# Patient Record
Sex: Male | Born: 1955
Health system: Southern US, Community
[De-identification: ages and names within clinical notes are randomized; demographics above are authoritative.]

## PROBLEM LIST (undated history)

## (undated) DIAGNOSIS — Z87442 Personal history of urinary calculi: Secondary | ICD-10-CM

## (undated) DIAGNOSIS — C801 Malignant (primary) neoplasm, unspecified: Secondary | ICD-10-CM

## (undated) DIAGNOSIS — K4090 Unilateral inguinal hernia, without obstruction or gangrene, not specified as recurrent: Secondary | ICD-10-CM

## (undated) DIAGNOSIS — I4891 Unspecified atrial fibrillation: Secondary | ICD-10-CM

## (undated) DIAGNOSIS — J302 Other seasonal allergic rhinitis: Secondary | ICD-10-CM

## (undated) DIAGNOSIS — J329 Chronic sinusitis, unspecified: Secondary | ICD-10-CM

## (undated) DIAGNOSIS — Z8489 Family history of other specified conditions: Secondary | ICD-10-CM

## (undated) HISTORY — PX: HERNIA REPAIR: SHX51

## (undated) HISTORY — PX: BASAL CELL CARCINOMA EXCISION: SHX1214

## (undated) HISTORY — PX: OTHER SURGICAL HISTORY: SHX169

---

## 1997-10-12 ENCOUNTER — Ambulatory Visit (HOSPITAL_COMMUNITY): Admission: RE | Admit: 1997-10-12 | Discharge: 1997-10-12 | Payer: Self-pay | Admitting: Dermatology

## 1999-09-01 ENCOUNTER — Ambulatory Visit (HOSPITAL_COMMUNITY): Admission: RE | Admit: 1999-09-01 | Discharge: 1999-09-01 | Payer: Self-pay | Admitting: Gastroenterology

## 1999-09-01 ENCOUNTER — Encounter: Payer: Self-pay | Admitting: Gastroenterology

## 1999-09-01 ENCOUNTER — Encounter (INDEPENDENT_AMBULATORY_CARE_PROVIDER_SITE_OTHER): Payer: Self-pay | Admitting: Specialist

## 2003-12-14 ENCOUNTER — Ambulatory Visit (HOSPITAL_BASED_OUTPATIENT_CLINIC_OR_DEPARTMENT_OTHER): Admission: RE | Admit: 2003-12-14 | Discharge: 2003-12-14 | Payer: Self-pay | Admitting: Family Medicine

## 2005-10-03 ENCOUNTER — Emergency Department (HOSPITAL_COMMUNITY): Admission: EM | Admit: 2005-10-03 | Discharge: 2005-10-03 | Payer: Self-pay | Admitting: Emergency Medicine

## 2005-10-04 ENCOUNTER — Ambulatory Visit (HOSPITAL_COMMUNITY): Admission: RE | Admit: 2005-10-04 | Discharge: 2005-10-04 | Payer: Self-pay | Admitting: Emergency Medicine

## 2006-01-26 ENCOUNTER — Emergency Department (HOSPITAL_COMMUNITY): Admission: EM | Admit: 2006-01-26 | Discharge: 2006-01-26 | Payer: Self-pay | Admitting: Emergency Medicine

## 2007-01-07 ENCOUNTER — Emergency Department (HOSPITAL_COMMUNITY): Admission: EM | Admit: 2007-01-07 | Discharge: 2007-01-07 | Payer: Self-pay | Admitting: Emergency Medicine

## 2007-05-15 DIAGNOSIS — I4891 Unspecified atrial fibrillation: Secondary | ICD-10-CM

## 2007-05-15 HISTORY — DX: Unspecified atrial fibrillation: I48.91

## 2008-01-25 ENCOUNTER — Emergency Department (HOSPITAL_COMMUNITY): Admission: EM | Admit: 2008-01-25 | Discharge: 2008-01-25 | Payer: Self-pay | Admitting: Emergency Medicine

## 2008-04-21 ENCOUNTER — Inpatient Hospital Stay (HOSPITAL_COMMUNITY): Admission: EM | Admit: 2008-04-21 | Discharge: 2008-04-22 | Payer: Self-pay | Admitting: Emergency Medicine

## 2010-09-26 NOTE — H&P (Signed)
[name], [name] NO.:  [id number]   MEDICAL RECORD NO.:  [id number]          PATIENT TYPE:  INP   LOCATION:  2038                         FACILITY:  [facility]   PHYSICIAN:  Jose Bathe, MD      DATE OF BIRTH:  10/13/1955   DATE OF ADMISSION:  04/21/2008  DATE OF DISCHARGE:                              HISTORY & PHYSICAL   PRIMARY CARE PHYSICIAN:  Jose L. Foy Guadalajara, MD   CHIEF COMPLAINT:  New-onset atrial fibrillation.   HISTORY OF PRESENT ILLNESS:  This is a 55 year old male with no prior  cardiac history who developed new-onset atrial fibrillation earlier this  morning.  He woke up in the middle of the with a bout of diarrhea x2 and  felt quite diaphoretic and nauseous surrounding this episode.  He denied  any fevers or chills.  He did feel some palpitations and some dizziness.  He went back to lay down and still continued to feel this why, and  therefore, went over to [name] for further  evaluation.  Once there in the [facility], he was noted to be in atrial  fibrillation with rapid ventricular response in the 140s.  He was then  promptly sent over to [name] for further  evaluation where an EKG confirmed the atrial fibrillation.  Cardizem  drip was then initiated and effectively, so his heart rate down into the  70s.  His blood pressure remained in the 90s systolic and 80s to 90s  systolic.  Therefore, he was given 1 L of normal saline bolus.   As far as exacerbation of atrial fibrillation, he does consume a large  quantity of Diet [facility] daily.  He also has seasonal allergies  with large doses of ALLEGRA-D or SUDAFED utilize.  He denies any alcohol  use.  He denies any previous thyroid problems.  Three 3 years ago, he  had an exercise treadmill test with [name] in [name], which  included an echo portion and supposedly his heart structurally and  functionally was normal.   He did have some associated  chest discomfort left sided with this  episode, rated as a 3-4/10 in intensity, and is currently very mild,  less than 1/10.  Currently, it is quite pinpoint along the left axillary  wall.  He has not had any exertional chest pain, however, recently.  He  denies any current shortness of breath.  He has lost approximately 40-50  pounds over the past several years.   PAST MEDICAL HISTORY:  1. Allergic rhinitis.  2. Psoriasis.  3. Obstructive sleep apnea - no longer wear a CPAP mass secondary to      aggressive weight loss.   ALLERGIES:  PENICILLIN.   MEDICATIONS:  1. Allegra-D 180 mg to 240 mg tablets once a day.  2. Astelin 137 mcg spray 2 puffs each nostril twice a day.  3. Advair Diskus 100/50 mcg dose 1 every 12 hours.  4. Singulair 10 mg a day in the evening.  5. Allergy shots by [name].   SOCIAL HISTORY:  He  denies any alcohol, tobacco, illicit drug use.  He  is married.  His wife is [name] for [name].  He is quite active.  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   FAMILY HISTORY:  He does have a brother who has had early coronary  artery disease with prior angioplasty, but he is overweight and smoker.  His paternal grandfather had a myocardial infarction at age 53.  His  mother supposedly had a myocardial infarction in her 30s without any  symptoms and was told this later in life.   REVIEW OF SYSTEMS:  Denies any fever, syncope, orthopnea.  Is positive  for dizziness, mild chest discomfort as above.  Unless specified above,  all other 12 review of systems is negative.  He denies any rash, recent  fevers.   PHYSICAL EXAMINATION:  VITAL SIGNS:  Blood pressure currently 90s  systolic, heart rate in the 70s atrial fibrillation originally was in  the 150s, respiration rate 16 and afebrile.  GENERAL:  Alert and oriented x3 in no acute distress, resting  comfortably in bed.  EYES:  Well-perfused conjunctivae.  EOMI.  No scleral icterus.   NECK:  Supple.  No thyromegaly, no carotid bruits, no JVD.  Full range  of motion.  CARDIOVASCULAR:  Irregularly irregular rhythm with no appreciable  murmurs.  Normal PMI.  LUNGS:  Clear to auscultation bilaterally.  No wheezes, no rales.  Normal respiratory effort.  ABDOMEN:  Soft, nontender, normoactive bowel sounds.  No rebound, no  guarding, no bruits.  EXTREMITIES:  No clubbing, cyanosis, or edema.  Normal distal pulses.  SKIN:  Warm, dry, and intact.  No rashes noted.  NEUROLOGIC:  Nonfocal.  No tremors.  PSYCH:  Normal affect.   LABORATORY DATA:  Cardiac biomarkers are normal x1.  Potassium was 4.9,  hemoglobin 17.7, hematocrit 52, creatinine 1.2.  White count 7.7,  platelets 290.  EKG as described above.  Chest x-ray personally reviewed  shows no acute airspace disease.  Prior medical records reviewed.  Echocardiogram from December 03, 2003, showed ejection fraction of 60% with  mild LVH 12 mm, the left atrial size was 37 mm.  All valves were normal.  He exercised for 10 minutes on the Bruce protocol, and there was no  chest pain or ST-segment changes.   ASSESSMENT AND PLAN:  This is a 55 year old male with new-onset atrial  fibrillation.  1. New-onset atrial fibrillation - Currently well-rate controlled on      Cardizem.  We will convert over to p.o. dose.  I will go ahead and      anticoagulate him with Lovenox just in case we need to cardiovert      him in the near future.  Hopefully, he will auto convert on his      own.  Possible etiologies to this atrial fibrillation include a      recent viral illness such as viral gastroenteritis.  Also caffeine      and Sudafed use are likely exacerbating factors.  We will check a      thyroid function.  He will need to decrease both of these.  Recent      echocardiogram in 2005 showed structurally normal heart.  2. Hypotension - likely secondary to mild amount of dehydration as      well as loss of atrial kick from atrial  fibrillation.  We will go      ahead and hydrate him with 1 L of normal  saline.  No other      therapies for now.  We will monitor closely in the setting of      Cardizem.  I will also evaluate with echocardiogram in near future.      Chest pain is quite atypical and now is clearly pinpoint, i.e.,      musculoskeletal, first set of cardiac biomarkers      are normal.  We will follow.  I may wish to perform a stress test      at later date.  I will also initiate Coumadin at this time just in      case cardioversion needs to take place in which case he would need      to be on Coumadin for 3 weeks.      Jose Bathe, MD  Electronically Signed     MCS/MEDQ  D:  04/21/2008  T:  04/22/2008  Job:  [id number]   cc:   Joycelyn Pace, M.D.

## 2010-09-26 NOTE — Discharge Summary (Signed)
[name], MUZQUIZ NO.:  [id number]   MEDICAL RECORD NO.:  [id number]          PATIENT TYPE:  INP   LOCATION:  2038                         FACILITY:  [facility]   PHYSICIAN:  Jake Bathe, MD      DATE OF BIRTH:  06-04-1955   DATE OF ADMISSION:  04/21/2008  DATE OF DISCHARGE:  04/22/2008                               DISCHARGE SUMMARY   PRIMARY CARE PHYSICIAN:  Dr. Foy Guadalajara.   FINAL DIAGNOSES:  1. Paroxysmal atrial fibrillation.  2. Hypotension.  3. Seasonal allergies - Dr. Artesia Callas, [facility] - allergy shots.  4. Obstructive sleep apnea - no longer wearing continuous positive      airway pressure mask secondary to weight loss of 40-50 pounds.  5. Psoriasis.   BRIEF HOSPITAL COURSE:  A 55 year old male with new-onset paroxysmal  atrial fibrillation who was admitted yesterday after seeing Dr. Joycelyn Rua with symptoms of dizziness and palpitations.  Earlier that night,  he had had 2 bouts of diarrhea and felt diaphoretic.  He did associate  some mild chest discomfort during this setting, which now on discharge  is completely relieved.  An echocardiogram was performed in 2005 which  was normal showing a normal left atrial size.  This was during an echo  stress test at Dr. Elyn Aquas office which showed no evidence of ischemia  and exercise time of 10 minutes.  After he was seen in the emergency  department, a Cardizem drip was initiated, then switched to a p.o.  He  was given Lovenox and aspirin.  At 1837, he auto converted to sinus  rhythm and has been in sinus rhythm/sinus tachycardia since.  He was  given 1 liter of bolus since he had hypotension and dizziness.  He  appeared mildly dehydrated.   As far as exacerbating factors of atrial fibrillation, it is possible  viral gastroenteritis, i.e., diarrhea that set atrial fibrillation off.  He also has concomitant excessive Mountain Dew/caffeine use as well as  Allegra-D with Sudafed in addition to Advair  with a long-acting beta  agonist.  On morning of discharge, he was back to his normal state of  health, feeling well, ambulating well.  His wife is Insurance claims handler at [employer] and is accompanying him at his  bedside.   On day of discharge:  VITAL SIGNS:  Blood pressure was 95/58 (normally at home, he runs in the  100-110 range), temperature 97.3, sating 98% on room air, heart rate was  75, respirations 20.  GENERAL:  Alert and oriented x3, in no acute distress.  CARDIOVASCULAR:  Regular rate and rhythm with no murmurs, rubs, or  gallops.  LUNGS:  Clear to auscultation bilaterally.  No wheezes.  No rales.  ABDOMEN:  Soft, nontender, normoactive bowel sounds.  EXTREMITIES:  No clubbing, cyanosis, or edema.  Normal distal pulses.   LABORATORY DATA:  White count 6.2, hemoglobin 15.6, hematocrit 45.6,  platelets 224.  Potassium 4.9.  Cardiac biomarkers normal.  TSH 1.0,  normal.   Chest x-ray, no acute disease.   DISCHARGE MEDICATIONS:  I asked them to  speak to Dr. [place] Callas who is going  to see him later this week about discontinuation of the D part of the  Allegra-D and also consideration of discontinuation of Advair given the  long-acting beta agonist.  1. Aspirin 81 mg once a day.  2. Cardizem CD 180 mg once a day.  3. Singular 10 mg a day.  4. Allegra 180 mg once a day.  5. Astelin nasal spray 137 mcg 2 puffs each nostril twice daily.  6. Advair 100/50 mcg every 12 hours.  7. Allergy shots weekly.   DISCHARGE INSTRUCTIONS:  I have asked him to reduce or eliminate  caffeine use.  He knows to report to me immediately if his symptoms  worsen.   APPOINTMENT:  He has his followup appointment with me on May 12, 2008, at 9:00 a.m. with an echocardiogram scheduled at that time to  ensure that there is no structural change of his heart in the setting of  new-onset atrial fibrillation.      Jake Bathe, MD  Electronically Signed     MCS/MEDQ  D:   04/22/2008  T:  04/22/2008  Job:  [id number]   cc:   Percival Spanish, M.D.  Joycelyn Rua, M.D.  Dr. Foy Guadalajara

## 2010-09-29 NOTE — Procedures (Signed)
NAME:  [name], [name]            ACCOUNT NO.:  [id number]   MEDICAL RECORD NO.:  [id number]          PATIENT TYPE:  OUT   LOCATION:  SLEEP CENTER                 FACILITY[facility]  [facility]   PHYSICIAN:  Clinton D. Maple Hudson, M.D. DATE OF BIRTH:  1956/01/09   DATE OF ADMISSION:  12/14/2003  DATE OF DISCHARGE:  12/14/2003                              NOCTURNAL POLYSOMNOGRAM   REFERRING PHYSICIAN:  Dr. Molly Maduro L. Foy Guadalajara.   INDICATION FOR STUDY/HISTORY:  Hypersomnia with sleep apnea.  Complaints of  excessive daytime fatigue, frequent waking after sleep onset, snoring.  The  patient had had a previous sleep study about 10 years ago; that report is  not available.   EPWORTH SLEEPINESS SCORE:  11/24.   NECK SIZE:  Sixteen inches.   BODY MASS INDEX:  BMI 29.6, weight 200 pounds.   MEDICATIONS:  No medications were listed.   SLEEP ARCHITECTURE:  Total sleep time 404 minutes for a sleep efficiency of  87%.  Stage I was 6%, stage II 60%, stages III and IV 14%; REM was 20% of  total sleep time. Latency to sleep onset was 9 minutes.  Latency to REM was  75 minutes.  Awake after sleep onset--64 minutes.  Arousal index--51 times  per hour.   RESPIRATORY DATA:   SPLIT-STUDY PROTOCOL:  Severe obstructive sleep apnea/hypopnea syndrome,  respiratory disturbance index (RDI) 50 per hour before CPAP including 36  obstructive apneas, 11 central apneas and 95 hypopneas.  Events were mostly  associated with supine sleeping position.  CPAP was titrated to 13 CWP (RDI  1.1 per hour) using a medium Respironics Comfort Gel nasal/oral mask with  heated humidifier.   OXYGEN DATA:  Moderate snoring before CPAP associated with mild-to-moderate  oxygen desaturation, 87%.  Some artifactual desaturations were recorded to  58%, which did not represent physiologic events.  Oxygenation on  CPAP was  held over 95% on room air.   CARDIAC DATA:  Normal cardiac rhythm.   MOVEMENT/PARASOMNIA:  Seventy-four body jerks were  recorded with very few  causing sleep disturbance.   IMPRESSION/RECOMMENDATION:  Severe obstructive sleep apnea/hypopnea syndrome  (respiratory disturbance index 50 per hour) with mild oxygen desaturation.  Successful control at continuous positive airway pressure 13 CWP using a  medium Respironics Comfort Gel nasal/oral mask with heated humidifier.                                   ______________________________                                Rennis Chris Maple Hudson, M.D.                                Diplomate, American Board of Sleep Medicine    CDY/MEDQ  D:  12/19/2003 10:32:18  T:  12/20/2003 14:13:52  Job:  [id number]

## 2011-02-16 LAB — POCT CARDIAC MARKERS
CKMB, poc: <1 ng/mL — ABNORMAL LOW (ref 1.0–8.0)
Troponin i, poc: <0.05 ng/mL (ref 0.00–0.09)

## 2011-02-16 LAB — CARDIAC PANEL(CRET KIN+CKTOT+MB+TROPI)
CK, MB: 0.7 ng/mL (ref 0.3–4.0)
Relative Index: INVALID (ref 0.0–2.5)
Relative Index: INVALID (ref 0.0–2.5)
Troponin I: <0.01 ng/mL (ref 0.00–0.06)

## 2011-02-16 LAB — POCT I-STAT, CHEM 8
Calcium, Ion: 1.21 mmol/L (ref 1.12–1.32)
Sodium: 140 mEq/L (ref 135–145)

## 2011-02-16 LAB — CBC
HCT: 51.4 % (ref 39.0–52.0)
Hemoglobin: 17.4 g/dL — ABNORMAL HIGH (ref 13.0–17.0)
MCHC: 33.7 g/dL (ref 30.0–36.0)
MCV: 90.6 fL (ref 78.0–100.0)
Platelets: 290 10*3/uL (ref 150–400)
RBC: 5.03 MIL/uL (ref 4.22–5.81)
RDW: 11.9 % (ref 11.5–15.5)
WBC: 6.2 10*3/uL (ref 4.0–10.5)

## 2011-02-16 LAB — TSH: TSH: 1.011 u[IU]/mL (ref 0.350–4.500)

## 2011-02-16 LAB — DIFFERENTIAL
Basophils Absolute: 0 10*3/uL (ref 0.0–0.1)
Basophils Relative: 1 % (ref 0–1)
Eosinophils Relative: 0 % (ref 0–5)
Monocytes Absolute: 0.6 10*3/uL (ref 0.1–1.0)

## 2011-02-16 LAB — POTASSIUM: Potassium: 4.9 mEq/L (ref 3.5–5.1)

## 2011-02-16 LAB — PROTIME-INR
Prothrombin Time: 12.7 seconds (ref 11.6–15.2)
Prothrombin Time: 13.4 seconds (ref 11.6–15.2)

## 2013-03-14 ENCOUNTER — Encounter: Payer: Self-pay | Admitting: Emergency Medicine

## 2013-03-14 ENCOUNTER — Emergency Department
Admission: EM | Admit: 2013-03-14 | Discharge: 2013-03-14 | Disposition: A | Discharge status: Home / Self Care | Payer: 59 | Source: Home / Self Care | Attending: Family Medicine | Admitting: Family Medicine

## 2013-03-14 DIAGNOSIS — H6093 Unspecified otitis externa, bilateral: Secondary | ICD-10-CM

## 2013-03-14 DIAGNOSIS — H6120 Impacted cerumen, unspecified ear: Secondary | ICD-10-CM

## 2013-03-14 DIAGNOSIS — H60399 Other infective otitis externa, unspecified ear: Secondary | ICD-10-CM

## 2013-03-14 DIAGNOSIS — H6123 Impacted cerumen, bilateral: Secondary | ICD-10-CM

## 2013-03-14 MED ORDER — NEOMYCIN-POLYMYXIN-HC 3.5-10000-1 OT SOLN: 3.0000 [drp] | Freq: Four times a day (QID) | OTIC | Status: AC

## 2013-03-14 MED ORDER — CARBAMIDE PEROXIDE 6.5 % OT SOLN: 5.0000 [drp] | Freq: Two times a day (BID) | OTIC | Status: DC

## 2013-03-14 NOTE — ED Notes (Signed)
States he is unable to hear out of ears d/t ear wax impaction.  Returned this am from scuba diving.

## 2013-03-14 NOTE — ED Provider Notes (Signed)
CSN: [id number]     Arrival date & time 03/14/13  1109 History   First MD Initiated Contact with Patient 03/14/13 1110     Chief Complaint  Patient presents with  . Cerumen Impaction    HPI  Bilateral cerumen impaction  Pt went to vacation in [place].  Went scuba diving.  Immediately felt bilateral ear pressure and discomfort.  Has had decreased hearing on R side.  No dizziness or headache.   History reviewed. No pertinent past medical history. Past Surgical History  Procedure Laterality Date  . Basal cell carcinoma excision    . Nasal biopsy     Family History  Problem Relation Age of Onset  . Cancer Father    History  Substance Use Topics  . Smoking status: Former Games developer  . Smokeless tobacco: Never Used  . Alcohol Use: No    Review of Systems  All other systems reviewed and are negative.    Allergies  Review of patient's allergies indicates no known allergies.  Home Medications  No current outpatient prescriptions on file. BP 138/83  Pulse 83  Temp(Src) 98.1 F (36.7 C) (Oral)  Ht 5\' 9"  (1.753 m)  Wt 200 lb (90.719 kg)  BMI 29.52 kg/m2  SpO2 97% Physical Exam  Constitutional: He appears well-developed and well-nourished.  HENT:  Head: Normocephalic and atraumatic.  Bilateral cerumen impaction   Eyes: Conjunctivae are normal. Pupils are equal, round, and reactive to light.  Neck: Normal range of motion. Neck supple.  Cardiovascular: Normal rate and regular rhythm.   Pulmonary/Chest: Effort normal.  Abdominal: Soft.  Musculoskeletal: Normal range of motion.  Neurological: He is alert.  Skin: Skin is warm.    ED Course  Procedures (including critical care time) Labs Review Labs Reviewed - No data to display Imaging Review No results found.    MDM   1. Cerumen impaction, bilateral   2. Otitis externa, bilateral    Bilateral ear irrigation at bedside.  Mild bilateral ear canal erythema s/p irrigation.  Will place on course of debrox and  cortisporin for treatment.  Discussed general care and ENT red flags.  Follow up as needed.     The patient and/or caregiver has been counseled thoroughly with regard to treatment plan and/or medications prescribed including dosage, schedule, interactions, rationale for use, and possible side effects and they verbalize understanding. Diagnoses and expected course of recovery discussed and will return if not improved as expected or if the condition worsens. Patient and/or caregiver verbalized understanding.          Doree Albee, MD 03/14/13 [id number]

## 2013-09-20 ENCOUNTER — Emergency Department (HOSPITAL_COMMUNITY): Payer: Commercial Managed Care - PPO

## 2013-09-20 ENCOUNTER — Encounter (HOSPITAL_COMMUNITY): Payer: Self-pay | Admitting: Emergency Medicine

## 2013-09-20 ENCOUNTER — Emergency Department (HOSPITAL_COMMUNITY)
Admission: EM | Admit: 2013-09-20 | Discharge: 2013-09-20 | Disposition: A | Discharge status: Home / Self Care | Payer: Commercial Managed Care - PPO | Attending: Emergency Medicine | Admitting: Emergency Medicine

## 2013-09-20 DIAGNOSIS — R918 Other nonspecific abnormal finding of lung field: Secondary | ICD-10-CM

## 2013-09-20 DIAGNOSIS — Z87891 Personal history of nicotine dependence: Secondary | ICD-10-CM | POA: Insufficient documentation

## 2013-09-20 DIAGNOSIS — Z8679 Personal history of other diseases of the circulatory system: Secondary | ICD-10-CM | POA: Insufficient documentation

## 2013-09-20 DIAGNOSIS — Z880 Allergy status to penicillin: Secondary | ICD-10-CM | POA: Insufficient documentation

## 2013-09-20 DIAGNOSIS — Z79899 Other long term (current) drug therapy: Secondary | ICD-10-CM | POA: Insufficient documentation

## 2013-09-20 DIAGNOSIS — Z87442 Personal history of urinary calculi: Secondary | ICD-10-CM | POA: Insufficient documentation

## 2013-09-20 DIAGNOSIS — R61 Generalized hyperhidrosis: Secondary | ICD-10-CM | POA: Insufficient documentation

## 2013-09-20 DIAGNOSIS — R911 Solitary pulmonary nodule: Secondary | ICD-10-CM | POA: Insufficient documentation

## 2013-09-20 DIAGNOSIS — R1032 Left lower quadrant pain: Secondary | ICD-10-CM | POA: Insufficient documentation

## 2013-09-20 DIAGNOSIS — R109 Unspecified abdominal pain: Secondary | ICD-10-CM

## 2013-09-20 DIAGNOSIS — M79609 Pain in unspecified limb: Secondary | ICD-10-CM | POA: Insufficient documentation

## 2013-09-20 HISTORY — DX: Unspecified atrial fibrillation: I48.91

## 2013-09-20 LAB — COMPREHENSIVE METABOLIC PANEL
ALBUMIN: 3.4 g/dL — AB (ref 3.5–5.2)
ALT: 14 U/L (ref 0–53)
AST: 13 U/L (ref 0–37)
Alkaline Phosphatase: 59 U/L (ref 39–117)
BUN: 16 mg/dL (ref 6–23)
CALCIUM: 8.9 mg/dL (ref 8.4–10.5)
CO2: 28 mEq/L (ref 19–32)
CREATININE: 0.92 mg/dL (ref 0.50–1.35)
Chloride: 100 mEq/L (ref 96–112)
GFR calc Af Amer: >90 mL/min (ref >90)
GFR calc non Af Amer: >90 mL/min (ref >90)
Glucose, Bld: 105 mg/dL — ABNORMAL HIGH (ref 70–99)
Potassium: 4.2 mEq/L (ref 3.7–5.3)
Sodium: 137 mEq/L (ref 137–147)
TOTAL PROTEIN: 6.4 g/dL (ref 6.0–8.3)
Total Bilirubin: 1.5 mg/dL — ABNORMAL HIGH (ref 0.3–1.2)

## 2013-09-20 LAB — URINALYSIS, ROUTINE W REFLEX MICROSCOPIC
Bilirubin Urine: NEGATIVE
GLUCOSE, UA: NEGATIVE mg/dL
Hgb urine dipstick: NEGATIVE
Ketones, ur: NEGATIVE mg/dL
LEUKOCYTES UA: NEGATIVE
Nitrite: NEGATIVE
PH: 8 (ref 5.0–8.0)
PROTEIN: NEGATIVE mg/dL
SPECIFIC GRAVITY, URINE: 1.034 — AB (ref 1.005–1.030)
Urobilinogen, UA: 0.2 mg/dL (ref 0.0–1.0)

## 2013-09-20 LAB — LIPASE, BLOOD: Lipase: 29 U/L (ref 11–59)

## 2013-09-20 LAB — CBC WITH DIFFERENTIAL/PLATELET
BASOS ABS: 0 10*3/uL (ref 0.0–0.1)
BASOS PCT: 0 % (ref 0–1)
EOS PCT: 1 % (ref 0–5)
Eosinophils Absolute: 0.1 10*3/uL (ref 0.0–0.7)
HEMATOCRIT: 47.9 % (ref 39.0–52.0)
Hemoglobin: 16.7 g/dL (ref 13.0–17.0)
Lymphocytes Relative: 25 % (ref 12–46)
Lymphs Abs: 1.9 10*3/uL (ref 0.7–4.0)
MCH: 30.8 pg (ref 26.0–34.0)
MCHC: 34.9 g/dL (ref 30.0–36.0)
MCV: 88.4 fL (ref 78.0–100.0)
MONO ABS: 0.7 10*3/uL (ref 0.1–1.0)
Monocytes Relative: 9 % (ref 3–12)
Neutro Abs: 4.8 10*3/uL (ref 1.7–7.7)
Neutrophils Relative %: 64 % (ref 43–77)
PLATELETS: 254 10*3/uL (ref 150–400)
RBC: 5.42 MIL/uL (ref 4.22–5.81)
RDW: 12.2 % (ref 11.5–15.5)
WBC: 7.5 10*3/uL (ref 4.0–10.5)

## 2013-09-20 MED ORDER — HYDROMORPHONE HCL PF 1 MG/ML IJ SOLN
1.0000 mg | Freq: Once | INTRAMUSCULAR | Status: AC
  Administered 2013-09-20: 1 mg via INTRAVENOUS
  Filled 2013-09-20: qty 1

## 2013-09-20 MED ORDER — NAPROXEN 500 MG PO TABS: 500.0000 mg | ORAL_TABLET | Freq: Two times a day (BID) | ORAL | Status: DC

## 2013-09-20 MED ORDER — ONDANSETRON HCL 4 MG/2ML IJ SOLN
4.0000 mg | Freq: Once | INTRAMUSCULAR | Status: AC
  Administered 2013-09-20: 4 mg via INTRAVENOUS
  Filled 2013-09-20: qty 2

## 2013-09-20 MED ORDER — MORPHINE SULFATE 4 MG/ML IJ SOLN
4.0000 mg | Freq: Once | INTRAMUSCULAR | Status: AC
  Administered 2013-09-20: 4 mg via INTRAVENOUS
  Filled 2013-09-20: qty 1

## 2013-09-20 MED ORDER — METHOCARBAMOL 500 MG PO TABS: 500.0000 mg | ORAL_TABLET | Freq: Two times a day (BID) | ORAL | Status: DC

## 2013-09-20 MED ORDER — IOHEXOL 300 MG/ML  SOLN
100.0000 mL | Freq: Once | INTRAMUSCULAR | Status: AC | PRN
  Administered 2013-09-20: 100 mL via INTRAVENOUS

## 2013-09-20 MED ORDER — KETOROLAC TROMETHAMINE 30 MG/ML IJ SOLN
30.0000 mg | Freq: Once | INTRAMUSCULAR | Status: AC
  Administered 2013-09-20: 30 mg via INTRAVENOUS
  Filled 2013-09-20: qty 1

## 2013-09-20 MED ORDER — IOHEXOL 300 MG/ML  SOLN
50.0000 mL | Freq: Once | INTRAMUSCULAR | Status: AC | PRN
  Administered 2013-09-20: 50 mL via ORAL

## 2013-09-20 MED ORDER — OXYCODONE-ACETAMINOPHEN 5-325 MG PO TABS: 1.0000 | ORAL_TABLET | ORAL | Status: DC | PRN

## 2013-09-20 MED ORDER — SODIUM CHLORIDE 0.9 % IV SOLN
INTRAVENOUS | Status: DC
  Administered 2013-09-20: 11:00:00 via INTRAVENOUS

## 2013-09-20 MED ORDER — SODIUM CHLORIDE 0.9 % IV BOLUS (SEPSIS)
1000.0000 mL | Freq: Once | INTRAVENOUS | Status: AC
  Administered 2013-09-20: 1000 mL via INTRAVENOUS

## 2013-09-20 NOTE — Discharge Instructions (Signed)
Abdominal Pain, Adult Many things can cause abdominal pain. Usually, abdominal pain is not caused by a disease and will improve without treatment. It can often be observed and treated at home. Your health care provider will do a physical exam and possibly order blood tests and X-rays to help determine the seriousness of your pain. However, in many cases, more time must pass before a clear cause of the pain can be found. Before that point, your health care provider may not know if you need more testing or further treatment. HOME CARE INSTRUCTIONS  Monitor your abdominal pain for any changes. The following actions may help to alleviate any discomfort you are experiencing:  Only take over-the-counter or prescription medicines as directed by your health care provider.  Do not take laxatives unless directed to do so by your health care provider.  Try a clear liquid diet (broth, tea, or water) as directed by your health care provider. Slowly move to a bland diet as tolerated. SEEK MEDICAL CARE IF:  You have unexplained abdominal pain.  You have abdominal pain associated with nausea or diarrhea.  You have pain when you urinate or have a bowel movement.  You experience abdominal pain that wakes you in the night.  You have abdominal pain that is worsened or improved by eating food.  You have abdominal pain that is worsened with eating fatty foods. SEEK IMMEDIATE MEDICAL CARE IF:   Your pain does not go away within 2 hours.  You have a fever.  You keep throwing up (vomiting).  Your pain is felt only in portions of the abdomen, such as the right side or the left lower portion of the abdomen.  You pass bloody or black tarry stools. MAKE SURE YOU:  Understand these instructions.   Will watch your condition.   Will get help right away if you are not doing well or get worse.  Document Released: 02/07/2005 Document Revised: 02/18/2013 Document Reviewed: 01/07/2013 [facility] Patient  Information 2014 [place]. You have a pulmonary nodule on the right side of your lungs. Call your Dr. to schedule a followup CAT scan of your chest. Return here at once for fever, vomiting, worsening abdominal pain. Pulmonary Nodule A pulmonary nodule is a small, round growth of tissue in the lung. Pulmonary nodules can range in size from less than 1/5 inch (4 mm) to a little bigger than an inch (25 mm). Most pulmonary nodules are detected when imaging tests of the lung are being performed for a different problem. Pulmonary nodules are usually not cancerous (benign). However, some pulmonary nodules are cancerous (malignant). Follow-up treatment or testing is based on the size of the pulmonary nodule and your risk of getting lung cancer.  CAUSES Benign pulmonary nodules can be caused by various things. Some of the causes include:   Bacterial, fungal, or viral infections. This is usually an old infection that is no longer active, but it can sometimes be a current, active infection.  A benign mass of tissue.  Inflammation from conditions such as rheumatoid arthritis.   Abnormal blood vessels in the lungs. Malignant pulmonary nodules can result from lung cancer or from cancers that spread to the lung from other places in the body. SIGNS AND SYMPTOMS Pulmonary nodules usually do not cause symptoms. DIAGNOSIS Most often, pulmonary nodules are found incidentally when an X-ray or CT scan is performed to look for some other problem in the lung area. To help determine whether a pulmonary nodule is benign or malignant, your  health care provider will take a medical history and order a variety of tests. Tests done may include:   Blood tests.  A skin test called a tuberculin test. This test is used to determine if you have been exposed to the germ that causes tuberculosis.   Chest X-rays. If possible, a new X-ray may be compared with X-rays you have had in the past.   CT scan. This test shows  smaller pulmonary nodules more clearly than an X-ray.   Positron emission tomography (PET) scan. In this test, a safe amount of a radioactive substance is injected into the bloodstream. Then, the scan takes a picture of the pulmonary nodule. The radioactive substance is eliminated from your body in your urine.   Biopsy. A tiny piece of the pulmonary nodule is removed so it can be checked under a microscope. TREATMENT  Pulmonary nodules that are benign normally do not require any treatment because they usually do not cause symptoms or breathing problems. Your health care provider may want to monitor the pulmonary nodule through follow-up CT scans. The frequency of these CT scans will vary based on the size of the nodule and the risk factors for lung cancer. For example, CT scans will need to be done more frequently if the pulmonary nodule is larger and if you have a history of smoking and a family history of cancer. Further testing or biopsies may be done if any follow-up CT scan shows that the size of the pulmonary nodule has increased. HOME CARE INSTRUCTIONS  Only take over-the-counter or prescription medicines as directed by your health care provider.  Keep all follow-up appointments with your health care provider. SEEK MEDICAL CARE IF:  You have trouble breathing when you are active.   You feel sick or unusually tired.   You do not feel like eating.   You lose weight without trying to.   You develop chills or night sweats.  SEEK IMMEDIATE MEDICAL CARE IF:  You cannot catch your breath, or you begin wheezing.   You cannot stop coughing.   You cough up blood.   You become dizzy or feel like you are going to pass out.   You have sudden chest pain.   You have a fever or persistent symptoms for more than 2 3 days.   You have a fever and your symptoms suddenly get worse. MAKE SURE YOU:  Understand these instructions.  Will watch your condition.  Will get help  right away if you are not doing well or get worse. Document Released: 02/25/2009 Document Revised: 12/31/2012 Document Reviewed: 10/20/2012 [facility] Patient Information 2014 [place].

## 2013-09-20 NOTE — ED Notes (Signed)
Pt attempted to provide urine specimen unable to do so at this time

## 2013-09-20 NOTE — ED Notes (Signed)
Pt has urinal and aware of need for urine specimen 

## 2013-09-20 NOTE — ED Notes (Signed)
Per pt, about 1 hour ago, pt c/o llq abdominal pain.  States hx of kidney stones but this does not "feel" the same.  Pain in abdomen and does not radiate to side/flank or back.  Nausea no vomiting.  Cold Sweats.  Diarrhea.

## 2013-09-20 NOTE — ED Provider Notes (Signed)
CSN: [id number]     Arrival date & time 09/20/13  0924 History   First MD Initiated Contact with Patient 09/20/13 [phone]     Chief Complaint  Patient presents with  . Abdominal Pain     (Consider location/radiation/quality/duration/timing/severity/associated sxs/prior Treatment) Patient is a 58 y.o. male presenting with abdominal pain. The history is provided by the patient and the spouse.  Abdominal Pain  patient here complaining of sudden onset of left lower quadrant pain it began approximate hour ago. Symptoms began at rest and is characterized as sharp and worse with movement. They are  better with rest. History of renal colic but this feels different. Denies any hematuria or dysuria. No fever or chills. Had some diarrhea but no nausea or vomiting. There was no blood in his stool. Did have some diaphoresis associated with this pain as well as some leg cramping. No rashes noted to have his flank. Denies any testicular pain or swelling. Symptoms have been persistent and no treatment used prior to arrival. Denies any history of diverticulitis or diverticulosis.  Past Medical History  Diagnosis Date  . Kidney stones   . Atrial fibrillation     history   Past Surgical History  Procedure Laterality Date  . Basal cell carcinoma excision    . Nasal biopsy     Family History  Problem Relation Age of Onset  . Cancer Father    History  Substance Use Topics  . Smoking status: Former Research scientist (life sciences)  . Smokeless tobacco: Never Used  . Alcohol Use: No    Review of Systems  Gastrointestinal: Positive for abdominal pain.  All other systems reviewed and are negative.     Allergies  Penicillins  Home Medications   Prior to Admission medications   Medication Sig Start Date End Date Taking? Authorizing Provider  carbamide peroxide (DEBROX) 6.5 % otic solution Place 5 drops into both ears 2 (two) times daily. 03/14/13   Shanda Howells, MD   BP 125/82  Pulse 79  Temp(Src) 97.7 F (36.5 C)  (Axillary)  Resp 18  SpO2 100% Physical Exam  Nursing note and vitals reviewed. Constitutional: He is oriented to person, place, and time. He appears well-developed and well-nourished.  Non-toxic appearance. No distress.  HENT:  Head: Normocephalic and atraumatic.  Eyes: Conjunctivae, EOM and lids are normal. Pupils are equal, round, and reactive to light.  Neck: Normal range of motion. Neck supple. No tracheal deviation present. No mass present.  Cardiovascular: Normal rate, regular rhythm and normal heart sounds.  Exam reveals no gallop.   No murmur heard. Pulmonary/Chest: Effort normal and breath sounds normal. No stridor. No respiratory distress. He has no decreased breath sounds. He has no wheezes. He has no rhonchi. He has no rales.  Abdominal: Soft. Normal appearance and bowel sounds are normal. He exhibits no distension. There is tenderness in the left lower quadrant. There is no rigidity, no rebound, no guarding and no CVA tenderness. Hernia confirmed negative in the left inguinal area.    Genitourinary: Testes normal.    Circumcised.  Musculoskeletal: Normal range of motion. He exhibits no edema and no tenderness.  Neurological: He is alert and oriented to person, place, and time. He has normal strength. No cranial nerve deficit or sensory deficit. GCS eye subscore is 4. GCS verbal subscore is 5. GCS motor subscore is 6.  Skin: Skin is warm and dry. No abrasion and no rash noted.  Psychiatric: He has a normal mood and affect. His speech is  normal and behavior is normal.    ED Course  Procedures (including critical care time) Labs Review Labs Reviewed  URINE CULTURE  CBC WITH DIFFERENTIAL  COMPREHENSIVE METABOLIC PANEL  LIPASE, BLOOD  URINALYSIS, ROUTINE W REFLEX MICROSCOPIC    Imaging Review No results found.   EKG Interpretation None      MDM   Final diagnoses:  None   No evidence of hernia on exam. Patient given medications and feels better. Pain seems to  be worse with certain movements along with standing. Given followup instructions concerning the pulmonary nodules. Will followup with his Dr. and return precautions given     Leota Jacobsen, MD 09/20/13 1321

## 2013-09-20 NOTE — ED Notes (Signed)
Could not get temp oral x 2.  Used axillary. Pt cool to touch.

## 2013-09-20 NOTE — ED Notes (Signed)
Patient in no acute distress.  Teachback method for discharge instructions and prezscriptions.

## 2013-09-21 LAB — URINE CULTURE
CULTURE: NO GROWTH
Colony Count: NO GROWTH

## 2013-09-28 ENCOUNTER — Other Ambulatory Visit (HOSPITAL_BASED_OUTPATIENT_CLINIC_OR_DEPARTMENT_OTHER): Payer: Self-pay | Admitting: Family Medicine

## 2013-09-28 DIAGNOSIS — R911 Solitary pulmonary nodule: Secondary | ICD-10-CM

## 2013-10-03 ENCOUNTER — Ambulatory Visit (HOSPITAL_BASED_OUTPATIENT_CLINIC_OR_DEPARTMENT_OTHER)
Admission: RE | Admit: 2013-10-03 | Discharge: 2013-10-03 | Disposition: A | Discharge status: Home / Self Care | Payer: Commercial Managed Care - PPO | Source: Ambulatory Visit | Attending: Family Medicine | Admitting: Family Medicine

## 2013-10-03 DIAGNOSIS — Z85828 Personal history of other malignant neoplasm of skin: Secondary | ICD-10-CM | POA: Insufficient documentation

## 2013-10-03 DIAGNOSIS — R918 Other nonspecific abnormal finding of lung field: Secondary | ICD-10-CM | POA: Insufficient documentation

## 2013-10-03 DIAGNOSIS — R911 Solitary pulmonary nodule: Secondary | ICD-10-CM

## 2013-10-03 MED ORDER — IOHEXOL 350 MG/ML SOLN
100.0000 mL | Freq: Once | INTRAVENOUS | Status: AC | PRN
  Administered 2013-10-03: 100 mL via INTRAVENOUS

## 2013-10-05 ENCOUNTER — Ambulatory Visit (HOSPITAL_BASED_OUTPATIENT_CLINIC_OR_DEPARTMENT_OTHER): Payer: Commercial Managed Care - PPO

## 2014-11-05 ENCOUNTER — Other Ambulatory Visit (HOSPITAL_BASED_OUTPATIENT_CLINIC_OR_DEPARTMENT_OTHER): Payer: Self-pay | Admitting: Family Medicine

## 2014-11-05 DIAGNOSIS — R918 Other nonspecific abnormal finding of lung field: Secondary | ICD-10-CM

## 2014-11-20 ENCOUNTER — Encounter (HOSPITAL_BASED_OUTPATIENT_CLINIC_OR_DEPARTMENT_OTHER): Payer: Self-pay

## 2014-11-20 ENCOUNTER — Ambulatory Visit (HOSPITAL_BASED_OUTPATIENT_CLINIC_OR_DEPARTMENT_OTHER)
Admission: RE | Admit: 2014-11-20 | Discharge: 2014-11-20 | Disposition: A | Discharge status: Home / Self Care | Payer: 59 | Source: Ambulatory Visit | Attending: Family Medicine | Admitting: Family Medicine

## 2014-11-20 DIAGNOSIS — R918 Other nonspecific abnormal finding of lung field: Secondary | ICD-10-CM | POA: Insufficient documentation

## 2014-11-20 HISTORY — DX: Malignant (primary) neoplasm, unspecified: C80.1

## 2014-11-20 MED ORDER — IOHEXOL 300 MG/ML  SOLN
75.0000 mL | Freq: Once | INTRAMUSCULAR | Status: AC | PRN
  Administered 2014-11-20: 75 mL via INTRAVENOUS

## 2015-05-17 DIAGNOSIS — J3089 Other allergic rhinitis: Secondary | ICD-10-CM | POA: Diagnosis not present

## 2015-05-17 DIAGNOSIS — J3081 Allergic rhinitis due to animal (cat) (dog) hair and dander: Secondary | ICD-10-CM | POA: Diagnosis not present

## 2015-05-17 DIAGNOSIS — J301 Allergic rhinitis due to pollen: Secondary | ICD-10-CM | POA: Diagnosis not present

## 2015-05-23 DIAGNOSIS — J301 Allergic rhinitis due to pollen: Secondary | ICD-10-CM | POA: Diagnosis not present

## 2015-05-23 DIAGNOSIS — J3081 Allergic rhinitis due to animal (cat) (dog) hair and dander: Secondary | ICD-10-CM | POA: Diagnosis not present

## 2015-05-23 DIAGNOSIS — J3089 Other allergic rhinitis: Secondary | ICD-10-CM | POA: Diagnosis not present

## 2015-05-31 DIAGNOSIS — J3081 Allergic rhinitis due to animal (cat) (dog) hair and dander: Secondary | ICD-10-CM | POA: Diagnosis not present

## 2015-05-31 DIAGNOSIS — J301 Allergic rhinitis due to pollen: Secondary | ICD-10-CM | POA: Diagnosis not present

## 2015-05-31 DIAGNOSIS — J3089 Other allergic rhinitis: Secondary | ICD-10-CM | POA: Diagnosis not present

## 2015-06-06 DIAGNOSIS — J301 Allergic rhinitis due to pollen: Secondary | ICD-10-CM | POA: Diagnosis not present

## 2015-06-06 DIAGNOSIS — J3081 Allergic rhinitis due to animal (cat) (dog) hair and dander: Secondary | ICD-10-CM | POA: Diagnosis not present

## 2015-06-06 DIAGNOSIS — J3089 Other allergic rhinitis: Secondary | ICD-10-CM | POA: Diagnosis not present

## 2015-06-08 DIAGNOSIS — D485 Neoplasm of uncertain behavior of skin: Secondary | ICD-10-CM | POA: Diagnosis not present

## 2015-06-08 DIAGNOSIS — L820 Inflamed seborrheic keratosis: Secondary | ICD-10-CM | POA: Diagnosis not present

## 2015-06-08 DIAGNOSIS — D225 Melanocytic nevi of trunk: Secondary | ICD-10-CM | POA: Diagnosis not present

## 2015-06-08 DIAGNOSIS — C44519 Basal cell carcinoma of skin of other part of trunk: Secondary | ICD-10-CM | POA: Diagnosis not present

## 2015-06-08 DIAGNOSIS — C44619 Basal cell carcinoma of skin of left upper limb, including shoulder: Secondary | ICD-10-CM | POA: Diagnosis not present

## 2015-06-08 DIAGNOSIS — Z85828 Personal history of other malignant neoplasm of skin: Secondary | ICD-10-CM | POA: Diagnosis not present

## 2015-06-08 DIAGNOSIS — L821 Other seborrheic keratosis: Secondary | ICD-10-CM | POA: Diagnosis not present

## 2015-06-13 DIAGNOSIS — J3089 Other allergic rhinitis: Secondary | ICD-10-CM | POA: Diagnosis not present

## 2015-06-13 DIAGNOSIS — J3081 Allergic rhinitis due to animal (cat) (dog) hair and dander: Secondary | ICD-10-CM | POA: Diagnosis not present

## 2015-06-13 DIAGNOSIS — J301 Allergic rhinitis due to pollen: Secondary | ICD-10-CM | POA: Diagnosis not present

## 2015-06-21 DIAGNOSIS — J3081 Allergic rhinitis due to animal (cat) (dog) hair and dander: Secondary | ICD-10-CM | POA: Diagnosis not present

## 2015-06-21 DIAGNOSIS — J3089 Other allergic rhinitis: Secondary | ICD-10-CM | POA: Diagnosis not present

## 2015-06-21 DIAGNOSIS — J301 Allergic rhinitis due to pollen: Secondary | ICD-10-CM | POA: Diagnosis not present

## 2015-06-27 DIAGNOSIS — J3081 Allergic rhinitis due to animal (cat) (dog) hair and dander: Secondary | ICD-10-CM | POA: Diagnosis not present

## 2015-06-27 DIAGNOSIS — J301 Allergic rhinitis due to pollen: Secondary | ICD-10-CM | POA: Diagnosis not present

## 2015-06-27 DIAGNOSIS — J3089 Other allergic rhinitis: Secondary | ICD-10-CM | POA: Diagnosis not present

## 2015-07-04 DIAGNOSIS — J3089 Other allergic rhinitis: Secondary | ICD-10-CM | POA: Diagnosis not present

## 2015-07-04 DIAGNOSIS — J3081 Allergic rhinitis due to animal (cat) (dog) hair and dander: Secondary | ICD-10-CM | POA: Diagnosis not present

## 2015-07-04 DIAGNOSIS — J301 Allergic rhinitis due to pollen: Secondary | ICD-10-CM | POA: Diagnosis not present

## 2015-07-07 DIAGNOSIS — L570 Actinic keratosis: Secondary | ICD-10-CM | POA: Diagnosis not present

## 2015-07-07 DIAGNOSIS — C44519 Basal cell carcinoma of skin of other part of trunk: Secondary | ICD-10-CM | POA: Diagnosis not present

## 2015-07-07 DIAGNOSIS — L738 Other specified follicular disorders: Secondary | ICD-10-CM | POA: Diagnosis not present

## 2015-07-07 DIAGNOSIS — D485 Neoplasm of uncertain behavior of skin: Secondary | ICD-10-CM | POA: Diagnosis not present

## 2015-07-11 DIAGNOSIS — J301 Allergic rhinitis due to pollen: Secondary | ICD-10-CM | POA: Diagnosis not present

## 2015-07-11 DIAGNOSIS — J3089 Other allergic rhinitis: Secondary | ICD-10-CM | POA: Diagnosis not present

## 2015-07-11 DIAGNOSIS — J3081 Allergic rhinitis due to animal (cat) (dog) hair and dander: Secondary | ICD-10-CM | POA: Diagnosis not present

## 2015-07-18 DIAGNOSIS — J301 Allergic rhinitis due to pollen: Secondary | ICD-10-CM | POA: Diagnosis not present

## 2015-07-18 DIAGNOSIS — J3081 Allergic rhinitis due to animal (cat) (dog) hair and dander: Secondary | ICD-10-CM | POA: Diagnosis not present

## 2015-07-18 DIAGNOSIS — J3089 Other allergic rhinitis: Secondary | ICD-10-CM | POA: Diagnosis not present

## 2015-07-21 MED FILL — VERAMYST 27.5 MCG NASAL SPR: 27.5 | 90 days supply | Qty: 30 | Fill #0

## 2015-07-21 MED FILL — MONTELUKAST SOD 10 MG TAB: 10 | 90 days supply | Qty: 90 | Fill #1

## 2015-07-25 DIAGNOSIS — J3089 Other allergic rhinitis: Secondary | ICD-10-CM | POA: Diagnosis not present

## 2015-07-25 DIAGNOSIS — J3081 Allergic rhinitis due to animal (cat) (dog) hair and dander: Secondary | ICD-10-CM | POA: Diagnosis not present

## 2015-07-25 DIAGNOSIS — J301 Allergic rhinitis due to pollen: Secondary | ICD-10-CM | POA: Diagnosis not present

## 2015-08-01 DIAGNOSIS — J3081 Allergic rhinitis due to animal (cat) (dog) hair and dander: Secondary | ICD-10-CM | POA: Diagnosis not present

## 2015-08-01 DIAGNOSIS — J3089 Other allergic rhinitis: Secondary | ICD-10-CM | POA: Diagnosis not present

## 2015-08-01 DIAGNOSIS — J301 Allergic rhinitis due to pollen: Secondary | ICD-10-CM | POA: Diagnosis not present

## 2015-08-08 DIAGNOSIS — J3081 Allergic rhinitis due to animal (cat) (dog) hair and dander: Secondary | ICD-10-CM | POA: Diagnosis not present

## 2015-08-08 DIAGNOSIS — J301 Allergic rhinitis due to pollen: Secondary | ICD-10-CM | POA: Diagnosis not present

## 2015-08-08 DIAGNOSIS — J3089 Other allergic rhinitis: Secondary | ICD-10-CM | POA: Diagnosis not present

## 2015-08-15 DIAGNOSIS — J301 Allergic rhinitis due to pollen: Secondary | ICD-10-CM | POA: Diagnosis not present

## 2015-08-15 DIAGNOSIS — J3081 Allergic rhinitis due to animal (cat) (dog) hair and dander: Secondary | ICD-10-CM | POA: Diagnosis not present

## 2015-08-15 DIAGNOSIS — J3089 Other allergic rhinitis: Secondary | ICD-10-CM | POA: Diagnosis not present

## 2015-08-22 DIAGNOSIS — J301 Allergic rhinitis due to pollen: Secondary | ICD-10-CM | POA: Diagnosis not present

## 2015-08-22 DIAGNOSIS — J3081 Allergic rhinitis due to animal (cat) (dog) hair and dander: Secondary | ICD-10-CM | POA: Diagnosis not present

## 2015-08-22 DIAGNOSIS — J3089 Other allergic rhinitis: Secondary | ICD-10-CM | POA: Diagnosis not present

## 2015-08-29 DIAGNOSIS — J3089 Other allergic rhinitis: Secondary | ICD-10-CM | POA: Diagnosis not present

## 2015-08-29 DIAGNOSIS — J3081 Allergic rhinitis due to animal (cat) (dog) hair and dander: Secondary | ICD-10-CM | POA: Diagnosis not present

## 2015-08-29 DIAGNOSIS — J301 Allergic rhinitis due to pollen: Secondary | ICD-10-CM | POA: Diagnosis not present

## 2015-09-05 DIAGNOSIS — J3089 Other allergic rhinitis: Secondary | ICD-10-CM | POA: Diagnosis not present

## 2015-09-05 DIAGNOSIS — J301 Allergic rhinitis due to pollen: Secondary | ICD-10-CM | POA: Diagnosis not present

## 2015-09-05 DIAGNOSIS — J3081 Allergic rhinitis due to animal (cat) (dog) hair and dander: Secondary | ICD-10-CM | POA: Diagnosis not present

## 2015-09-12 DIAGNOSIS — J3081 Allergic rhinitis due to animal (cat) (dog) hair and dander: Secondary | ICD-10-CM | POA: Diagnosis not present

## 2015-09-12 DIAGNOSIS — J301 Allergic rhinitis due to pollen: Secondary | ICD-10-CM | POA: Diagnosis not present

## 2015-09-12 DIAGNOSIS — J3089 Other allergic rhinitis: Secondary | ICD-10-CM | POA: Diagnosis not present

## 2015-09-19 DIAGNOSIS — J3081 Allergic rhinitis due to animal (cat) (dog) hair and dander: Secondary | ICD-10-CM | POA: Diagnosis not present

## 2015-09-19 DIAGNOSIS — J3089 Other allergic rhinitis: Secondary | ICD-10-CM | POA: Diagnosis not present

## 2015-09-19 DIAGNOSIS — J301 Allergic rhinitis due to pollen: Secondary | ICD-10-CM | POA: Diagnosis not present

## 2015-09-26 DIAGNOSIS — J3089 Other allergic rhinitis: Secondary | ICD-10-CM | POA: Diagnosis not present

## 2015-09-26 DIAGNOSIS — J3081 Allergic rhinitis due to animal (cat) (dog) hair and dander: Secondary | ICD-10-CM | POA: Diagnosis not present

## 2015-09-26 DIAGNOSIS — J301 Allergic rhinitis due to pollen: Secondary | ICD-10-CM | POA: Diagnosis not present

## 2015-10-03 DIAGNOSIS — J3081 Allergic rhinitis due to animal (cat) (dog) hair and dander: Secondary | ICD-10-CM | POA: Diagnosis not present

## 2015-10-03 DIAGNOSIS — J3089 Other allergic rhinitis: Secondary | ICD-10-CM | POA: Diagnosis not present

## 2015-10-03 DIAGNOSIS — J301 Allergic rhinitis due to pollen: Secondary | ICD-10-CM | POA: Diagnosis not present

## 2015-10-08 ENCOUNTER — Encounter: Payer: Self-pay | Admitting: Emergency Medicine

## 2015-10-08 ENCOUNTER — Emergency Department
Admission: EM | Admit: 2015-10-08 | Discharge: 2015-10-08 | Disposition: A | Discharge status: Home / Self Care | Payer: Commercial Managed Care - PPO | Source: Home / Self Care | Attending: Family Medicine | Admitting: Family Medicine

## 2015-10-08 DIAGNOSIS — B9789 Other viral agents as the cause of diseases classified elsewhere: Secondary | ICD-10-CM

## 2015-10-08 DIAGNOSIS — J069 Acute upper respiratory infection, unspecified: Secondary | ICD-10-CM

## 2015-10-08 DIAGNOSIS — H6612 Chronic tubotympanic suppurative otitis media, left ear: Secondary | ICD-10-CM | POA: Diagnosis not present

## 2015-10-08 MED ORDER — PREDNISONE 20 MG PO TABS: ORAL_TABLET | ORAL | Status: DC

## 2015-10-08 MED ORDER — AZITHROMYCIN 250 MG PO TABS: ORAL_TABLET | ORAL | Status: DC

## 2015-10-08 MED ORDER — BENZONATATE 200 MG PO CAPS: 200.0000 mg | ORAL_CAPSULE | Freq: Every day | ORAL | Status: DC

## 2015-10-08 NOTE — ED Provider Notes (Signed)
CSN: [id number]     Arrival date & time 10/08/15  0907 History   None    Chief Complaint  Patient presents with  . URI      HPI Comments: About one week ago patient developed typical cold-like symptoms developing over several days,  including mild sore throat, sinus congestion, headache, fatigue, and cough.  Yesterday he developed bilateral ear ache. He has a history of perennial rhinitis. He has a past history of pneumonia.  The history is provided by the patient.    Past Medical History  Diagnosis Date  . Kidney stones   . Atrial fibrillation ([place])     history  . Cancer [facility])    Past Surgical History  Procedure Laterality Date  . Basal cell carcinoma excision    . Nasal biopsy     Family History  Problem Relation Age of Onset  . Cancer Father    Social History  Substance Use Topics  . Smoking status: Former Research scientist (life sciences)  . Smokeless tobacco: Never Used  . Alcohol Use: No    Review of Systems + sore throat + cough No pleuritic pain No wheezing + nasal congestion + post-nasal drainage No sinus pain/pressure No itchy/red eyes + earache No hemoptysis No SOB No fever/chills No nausea No vomiting No abdominal pain No diarrhea No urinary symptoms No skin rash + fatigue No myalgias + headache Used OTC meds without relief  Allergies  Penicillins and Vegetable laxative  Home Medications   Prior to Admission medications   Medication Sig Start Date End Date Taking? Authorizing Provider  azithromycin (ZITHROMAX Z-PAK) 250 MG tablet Take 2 tabs today; then begin one tab once daily for 4 more days. 10/08/15   Kandra Nicolas, MD  benzonatate (TESSALON) 200 MG capsule Take 1 capsule (200 mg total) by mouth at bedtime. Take as needed for cough 10/08/15   Kandra Nicolas, MD  predniSONE (DELTASONE) 20 MG tablet Take one tab by mouth twice daily for 5 days, then one daily for 3 days. Take with food. 10/08/15   Kandra Nicolas, MD   Meds Ordered and Administered this Visit   Medications - No data to display  BP 148/84 mmHg  Pulse 92  Temp(Src) 97.8 F (36.6 C) (Oral)  Ht 5\' 9"  (1.753 m)  Wt 211 lb (95.709 kg)  BMI 31.15 kg/m2  SpO2 97% No data found.   Physical Exam Nursing notes and Vital Signs reviewed. Appearance:  Patient appears stated age, and in no acute distress Eyes:  Pupils are equal, round, and reactive to light and accomodation.  Extraocular movement is intact.  Conjunctivae are not inflamed  Ears:  Canals partly occluded with cerumen.  Right tympanic membrane barely visible; left tympanic membrane not fully visible. Nose:  Mildly congested turbinates.  No sinus tenderness.   Pharynx:  Normal Neck:  Supple.  Tender enlarged posterior/lateral nodes are palpated bilaterally  Lungs:  Clear to auscultation.  Breath sounds are equal.  Moving air well. Heart:  Regular rate and rhythm without murmurs, rubs, or gallops.  Abdomen:  Nontender without masses or hepatosplenomegaly.  Bowel sounds are present.  No CVA or flank tenderness.  Extremities:  No edema.  Skin:  No rash present.   ED Course  Procedures none    Labs Reviewed -  Tympanometry:  Left ear positive peak pressure; right ear negative peak pressure I   MDM   1. Tubotympanic suppurative otitis media of left ear, unspecified chronicity   2. Viral  URI with cough    Begin Z-pak for atypical coverage, and prednisone burst/taper.  Prescription written for Benzonatate [facility]) to take at bedtime for night-time cough.  Take plain guaifenesin (1200mg  extended release tabs such as Mucinex) twice daily, with plenty of water, for cough and congestion.  May add Pseudoephedrine (30mg , one or two every 4 to 6 hours) for sinus congestion.  Get adequate rest.   May use Afrin nasal spray (or generic oxymetazoline) twice daily for about 5 days and then discontinue.  Also recommend using saline nasal spray several times daily and saline nasal irrigation (AYR is a common brand).  Use Flonase nasal  spray each morning after using Afrin nasal spray and saline nasal irrigation. Try warm salt water gargles for sore throat.  Stop all antihistamines for now, and other non-prescription cough/cold preparations.  Follow-up with family doctor if not improving about one week.    Kandra Nicolas, MD 10/13/15 [phone]

## 2015-10-08 NOTE — Discharge Instructions (Signed)

## 2015-10-08 NOTE — ED Notes (Signed)
Pt c/o productive cough x 6 days, HA, runny nose, bilateral ear pain

## 2015-10-11 ENCOUNTER — Telehealth: Payer: Self-pay | Admitting: *Deleted

## 2015-10-11 DIAGNOSIS — J3089 Other allergic rhinitis: Secondary | ICD-10-CM | POA: Diagnosis not present

## 2015-10-11 DIAGNOSIS — J301 Allergic rhinitis due to pollen: Secondary | ICD-10-CM | POA: Diagnosis not present

## 2015-10-11 DIAGNOSIS — J3081 Allergic rhinitis due to animal (cat) (dog) hair and dander: Secondary | ICD-10-CM | POA: Diagnosis not present

## 2015-10-11 NOTE — ED Notes (Signed)
Phone note opened to add order for tympanogram order that had expired

## 2015-10-17 DIAGNOSIS — J3089 Other allergic rhinitis: Secondary | ICD-10-CM | POA: Diagnosis not present

## 2015-10-17 DIAGNOSIS — J301 Allergic rhinitis due to pollen: Secondary | ICD-10-CM | POA: Diagnosis not present

## 2015-10-17 DIAGNOSIS — J3081 Allergic rhinitis due to animal (cat) (dog) hair and dander: Secondary | ICD-10-CM | POA: Diagnosis not present

## 2015-10-24 DIAGNOSIS — J3089 Other allergic rhinitis: Secondary | ICD-10-CM | POA: Diagnosis not present

## 2015-10-24 DIAGNOSIS — J301 Allergic rhinitis due to pollen: Secondary | ICD-10-CM | POA: Diagnosis not present

## 2015-10-24 DIAGNOSIS — J3081 Allergic rhinitis due to animal (cat) (dog) hair and dander: Secondary | ICD-10-CM | POA: Diagnosis not present

## 2015-10-31 DIAGNOSIS — J3081 Allergic rhinitis due to animal (cat) (dog) hair and dander: Secondary | ICD-10-CM | POA: Diagnosis not present

## 2015-10-31 DIAGNOSIS — J301 Allergic rhinitis due to pollen: Secondary | ICD-10-CM | POA: Diagnosis not present

## 2015-10-31 DIAGNOSIS — J3089 Other allergic rhinitis: Secondary | ICD-10-CM | POA: Diagnosis not present

## 2015-11-03 MED FILL — VERAMYST 27.5 MCG NASAL SPR: 27.5 | 90 days supply | Qty: 30 | Fill #1

## 2015-11-07 DIAGNOSIS — J301 Allergic rhinitis due to pollen: Secondary | ICD-10-CM | POA: Diagnosis not present

## 2015-11-07 DIAGNOSIS — J3081 Allergic rhinitis due to animal (cat) (dog) hair and dander: Secondary | ICD-10-CM | POA: Diagnosis not present

## 2015-11-07 DIAGNOSIS — J3089 Other allergic rhinitis: Secondary | ICD-10-CM | POA: Diagnosis not present

## 2015-11-16 DIAGNOSIS — J3089 Other allergic rhinitis: Secondary | ICD-10-CM | POA: Diagnosis not present

## 2015-11-16 DIAGNOSIS — J3081 Allergic rhinitis due to animal (cat) (dog) hair and dander: Secondary | ICD-10-CM | POA: Diagnosis not present

## 2015-11-16 DIAGNOSIS — J301 Allergic rhinitis due to pollen: Secondary | ICD-10-CM | POA: Diagnosis not present

## 2015-11-16 MED FILL — MONTELUKAST SOD 10 MG TAB: 10 | 90 days supply | Qty: 90 | Fill #0

## 2015-11-21 DIAGNOSIS — J3081 Allergic rhinitis due to animal (cat) (dog) hair and dander: Secondary | ICD-10-CM | POA: Diagnosis not present

## 2015-11-21 DIAGNOSIS — J3089 Other allergic rhinitis: Secondary | ICD-10-CM | POA: Diagnosis not present

## 2015-11-21 DIAGNOSIS — J301 Allergic rhinitis due to pollen: Secondary | ICD-10-CM | POA: Diagnosis not present

## 2015-11-28 DIAGNOSIS — J3089 Other allergic rhinitis: Secondary | ICD-10-CM | POA: Diagnosis not present

## 2015-11-28 DIAGNOSIS — J3081 Allergic rhinitis due to animal (cat) (dog) hair and dander: Secondary | ICD-10-CM | POA: Diagnosis not present

## 2015-11-28 DIAGNOSIS — J301 Allergic rhinitis due to pollen: Secondary | ICD-10-CM | POA: Diagnosis not present

## 2015-11-29 DIAGNOSIS — J3081 Allergic rhinitis due to animal (cat) (dog) hair and dander: Secondary | ICD-10-CM | POA: Diagnosis not present

## 2015-11-29 DIAGNOSIS — J3089 Other allergic rhinitis: Secondary | ICD-10-CM | POA: Diagnosis not present

## 2015-11-29 DIAGNOSIS — J301 Allergic rhinitis due to pollen: Secondary | ICD-10-CM | POA: Diagnosis not present

## 2015-12-05 DIAGNOSIS — J3089 Other allergic rhinitis: Secondary | ICD-10-CM | POA: Diagnosis not present

## 2015-12-05 DIAGNOSIS — J301 Allergic rhinitis due to pollen: Secondary | ICD-10-CM | POA: Diagnosis not present

## 2015-12-05 DIAGNOSIS — J3081 Allergic rhinitis due to animal (cat) (dog) hair and dander: Secondary | ICD-10-CM | POA: Diagnosis not present

## 2015-12-05 IMAGING — CT CT CHEST W/ CM
2 of 3 series · 15 of 36 positions shown, 18 images · IV contrast (APPLIED)
Comparison: CT chest dated 10/03/2013

CLINICAL DATA: Follow-up lung nodules

EXAM:
CT CHEST WITH CONTRAST
TECHNIQUE: Multidetector CT imaging of the chest was performed during
intravenous contrast administration.
CONTRAST:  75mL OMNIPAQUE IOHEXOL 300 MG/ML  SOLN

[Series 2: chest 5.0 b31f · axial · 0.71mm/px · z∈[+962,+1218]mm · 12 of 61 slices shown, 15 images]
[im 5/61  mediastinal]
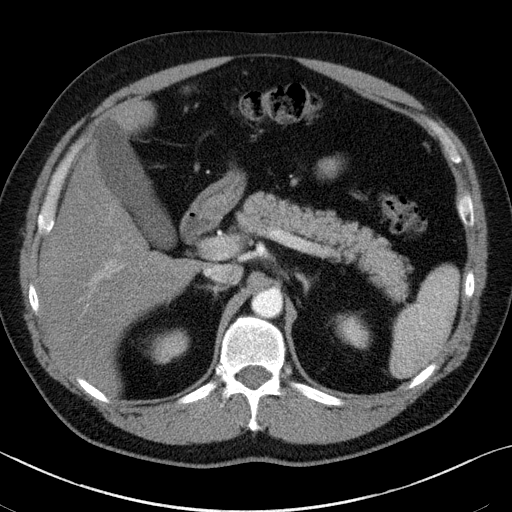
[im 5/61  lung]
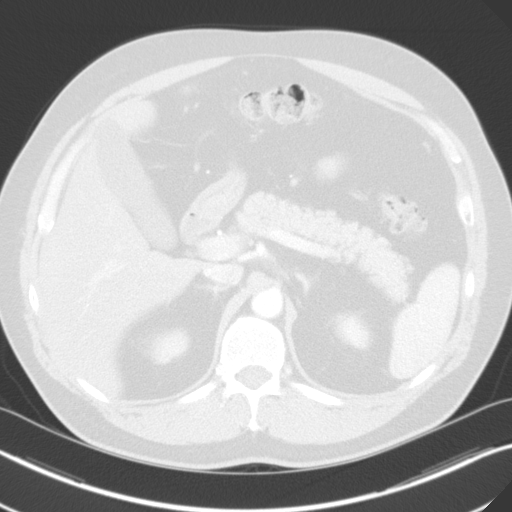
[im 9/61  lung]
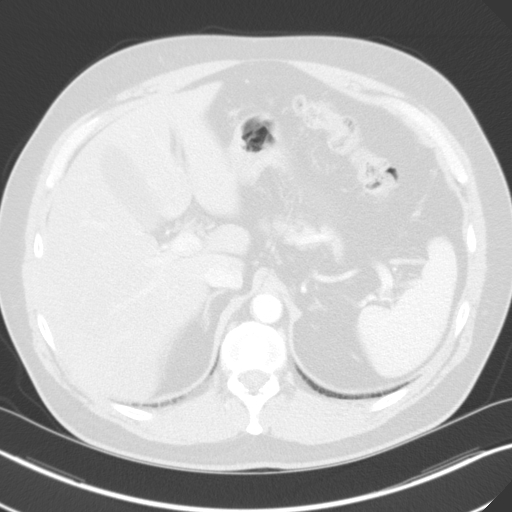
[im 14/61  lung]
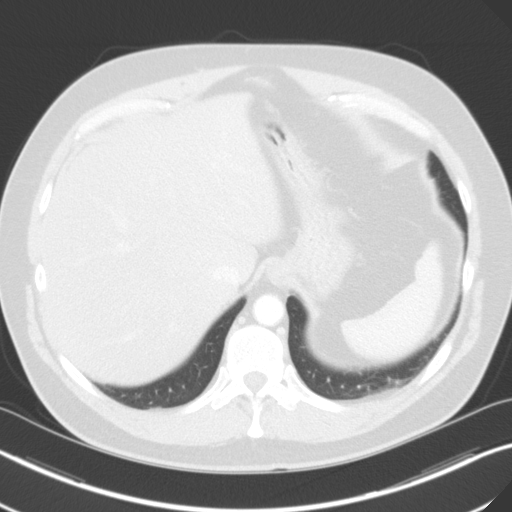
[im 18/61  lung]
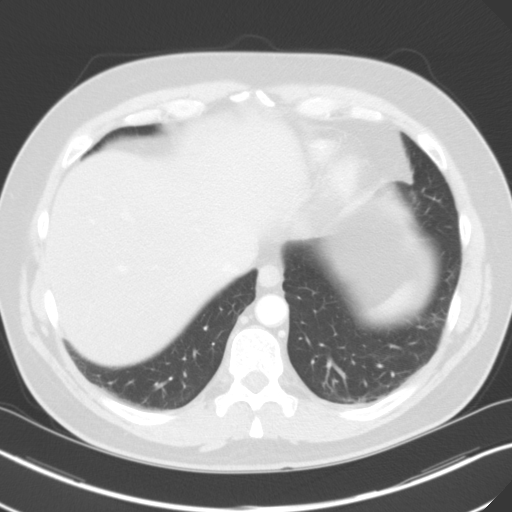
[im 23/61  mediastinal]
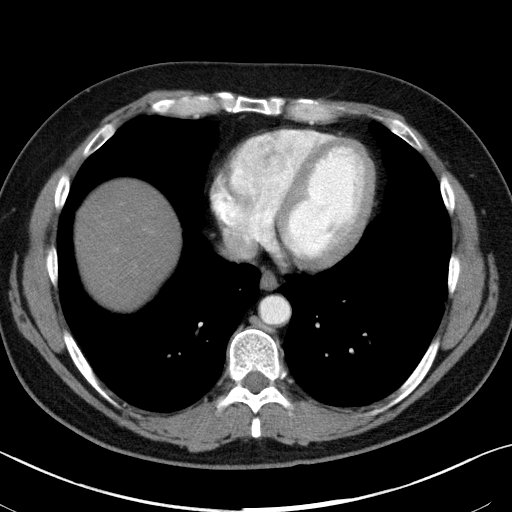
[im 23/61  lung]
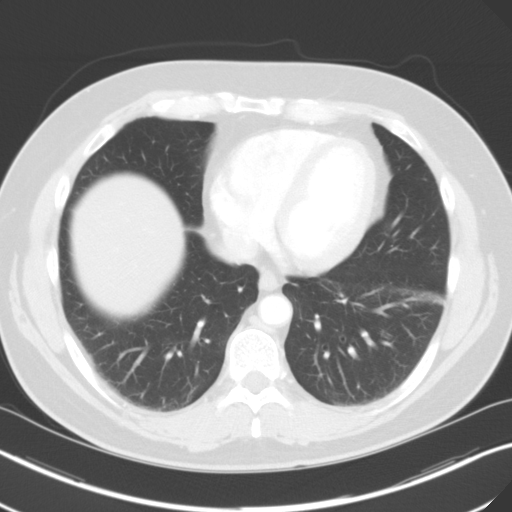
[im 27/61  lung]
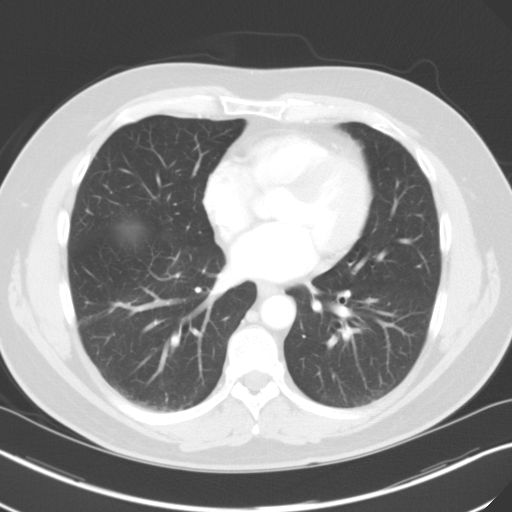
[im 34/61  lung]
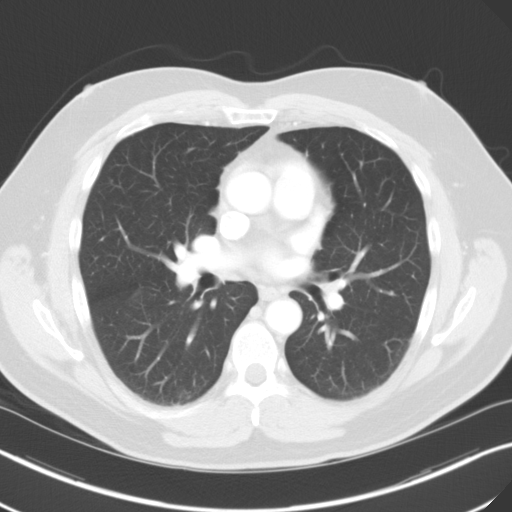
[im 38/61  lung]
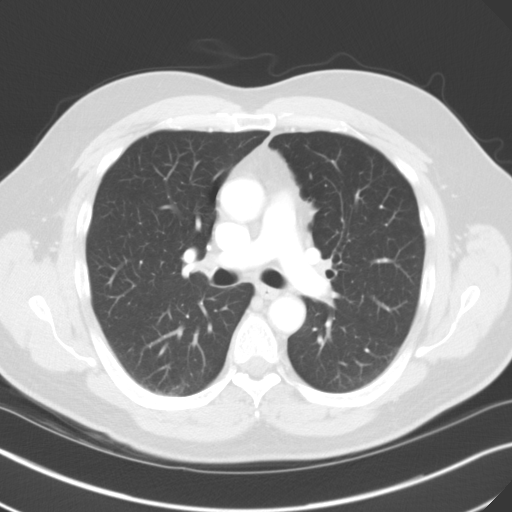
[im 43/61  mediastinal]
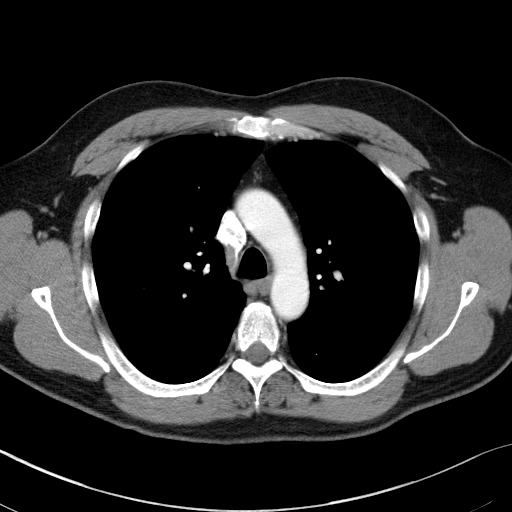
[im 43/61  lung]
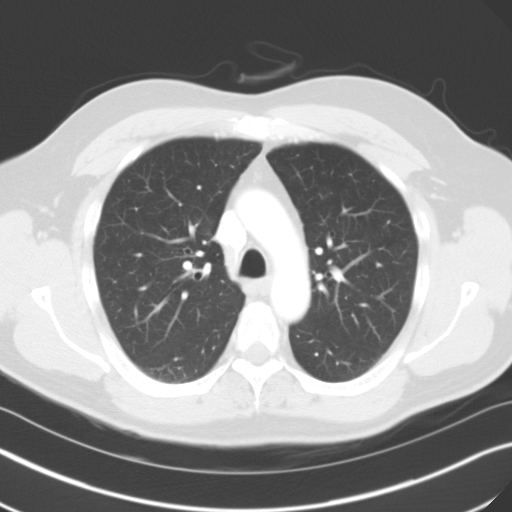
[im 47/61  lung]
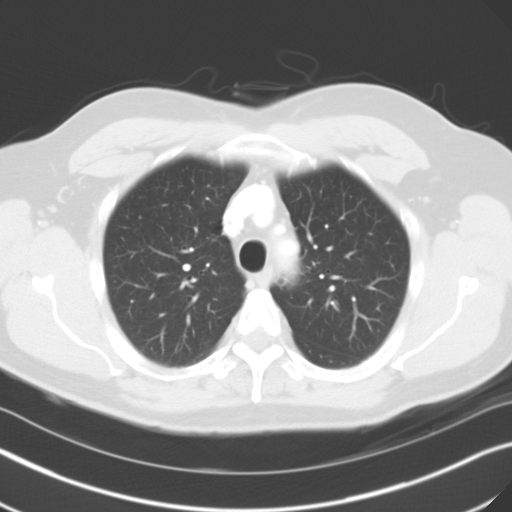
[im 52/61  lung]
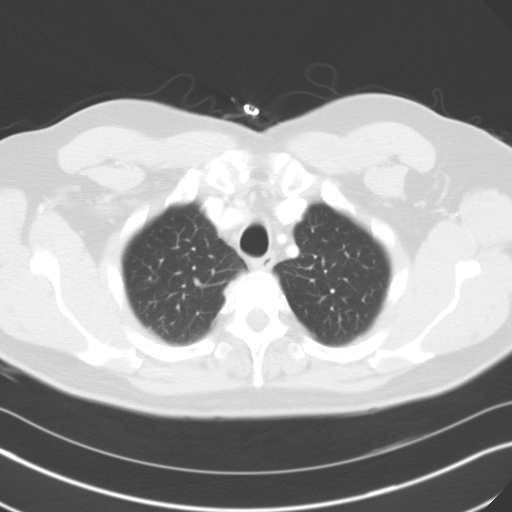
[im 56/61  lung]
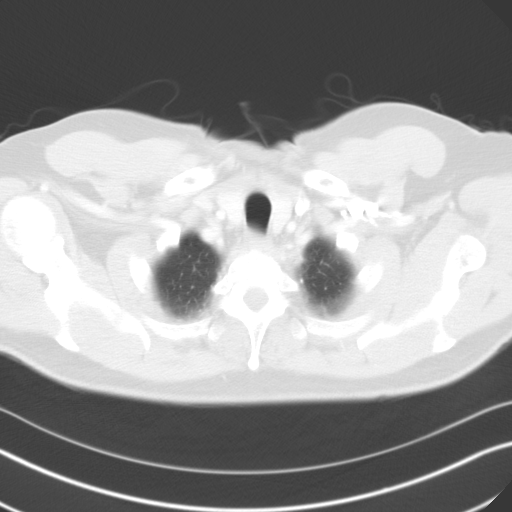

[Series 6: chest 3.0 coronal · coronal · 0.61mm/px · 3 of 92 slices shown]
[im 19/92  lung]
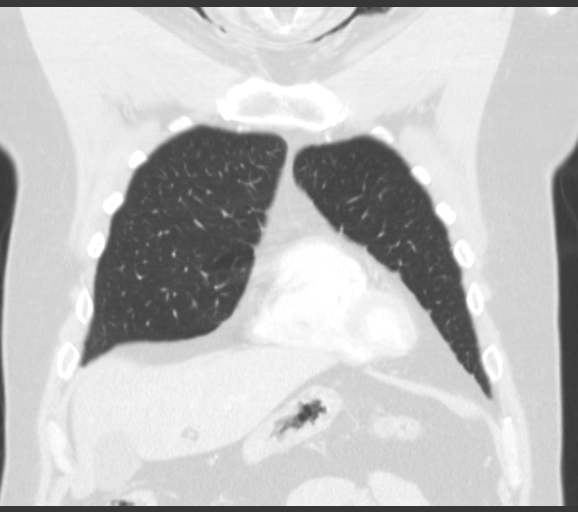
[im 37/92  lung]
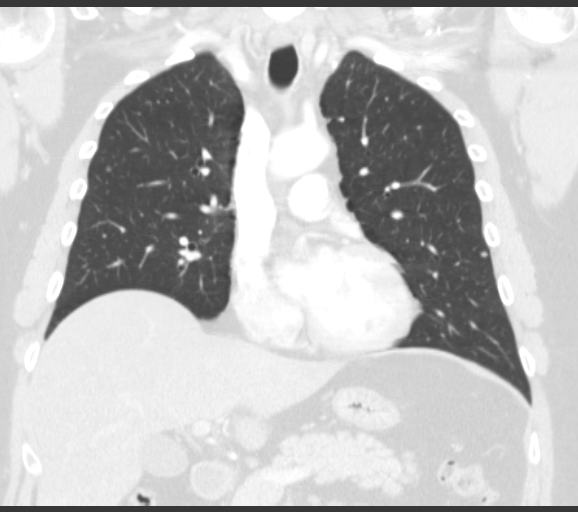
[im 55/92  lung]
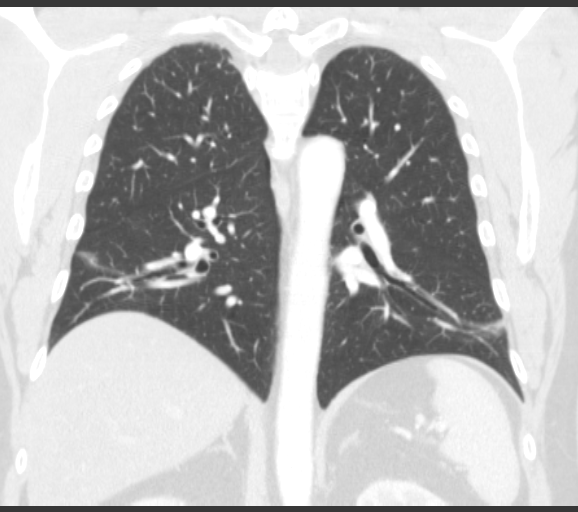

[15 of 36 positions shown; findings below may reference images not displayed]

FINDINGS: Mediastinum/Nodes: The heart is normal in size. No pericardial
effusion.

Mild atherosclerotic calcifications of the aortic arch.

Small mediastinal lymph nodes which do not meet pathologic CT size
criteria. No suspicious hilar or axillary lymphadenopathy.

Visualized thyroid is unremarkable.

Lungs/Pleura: Scattered small bilateral pulmonary nodules,
unchanged, including:

--4 mm right upper lobe nodule (series 3/ image 21)

--5 mm right middle lobe nodule (series 3/ image 25)

--4 mm left upper lobe nodule (series 3/ image 31)

--5 mm subpleural right middle lobe nodule (series 3/ image 33)

No new/ suspicious pulmonary nodules.

Mild dependent atelectasis in the bilateral lower lobes. No focal
consolidation.

No pleural effusion or pneumothorax.

Upper abdomen: Visualized upper abdomen is unremarkable.

Musculoskeletal: Visualized osseous structures are within normal
limits.
IMPRESSION: Scattered small bilateral pulmonary nodules measuring up to 5 mm in
the right middle lobe, unchanged, likely benign. These are not
considered suspicious for metastases given interval stability.

14 month stability has been demonstrated. If this patient is low
risk for primary bronchogenic neoplasm, no further imaging is
required per [HOSPITAL] guidelines. If high risk, a single
follow-up CT chest can be performed in 4-10 months (18-24 months
from initial CT).

This recommendation follows the consensus statement: Guidelines for
Management of Small Pulmonary Nodules Detected on CT Scans: A
Statement from the [HOSPITAL] as published in Radiology

## 2015-12-12 DIAGNOSIS — J3081 Allergic rhinitis due to animal (cat) (dog) hair and dander: Secondary | ICD-10-CM | POA: Diagnosis not present

## 2015-12-12 DIAGNOSIS — J3089 Other allergic rhinitis: Secondary | ICD-10-CM | POA: Diagnosis not present

## 2015-12-12 DIAGNOSIS — J301 Allergic rhinitis due to pollen: Secondary | ICD-10-CM | POA: Diagnosis not present

## 2015-12-19 DIAGNOSIS — J3081 Allergic rhinitis due to animal (cat) (dog) hair and dander: Secondary | ICD-10-CM | POA: Diagnosis not present

## 2015-12-19 DIAGNOSIS — J301 Allergic rhinitis due to pollen: Secondary | ICD-10-CM | POA: Diagnosis not present

## 2015-12-19 DIAGNOSIS — J3089 Other allergic rhinitis: Secondary | ICD-10-CM | POA: Diagnosis not present

## 2015-12-26 DIAGNOSIS — J3081 Allergic rhinitis due to animal (cat) (dog) hair and dander: Secondary | ICD-10-CM | POA: Diagnosis not present

## 2015-12-26 DIAGNOSIS — J301 Allergic rhinitis due to pollen: Secondary | ICD-10-CM | POA: Diagnosis not present

## 2015-12-26 DIAGNOSIS — J3089 Other allergic rhinitis: Secondary | ICD-10-CM | POA: Diagnosis not present

## 2016-01-02 DIAGNOSIS — J3081 Allergic rhinitis due to animal (cat) (dog) hair and dander: Secondary | ICD-10-CM | POA: Diagnosis not present

## 2016-01-02 DIAGNOSIS — J3089 Other allergic rhinitis: Secondary | ICD-10-CM | POA: Diagnosis not present

## 2016-01-02 DIAGNOSIS — J301 Allergic rhinitis due to pollen: Secondary | ICD-10-CM | POA: Diagnosis not present

## 2016-01-10 DIAGNOSIS — J3081 Allergic rhinitis due to animal (cat) (dog) hair and dander: Secondary | ICD-10-CM | POA: Diagnosis not present

## 2016-01-10 DIAGNOSIS — J301 Allergic rhinitis due to pollen: Secondary | ICD-10-CM | POA: Diagnosis not present

## 2016-01-10 DIAGNOSIS — J3089 Other allergic rhinitis: Secondary | ICD-10-CM | POA: Diagnosis not present

## 2016-01-18 ENCOUNTER — Encounter: Payer: Self-pay | Admitting: *Deleted

## 2016-01-18 ENCOUNTER — Emergency Department
Admission: EM | Admit: 2016-01-18 | Discharge: 2016-01-18 | Disposition: A | Discharge status: Home / Self Care | Payer: 59 | Source: Home / Self Care | Attending: Family Medicine | Admitting: Family Medicine

## 2016-01-18 DIAGNOSIS — H6123 Impacted cerumen, bilateral: Secondary | ICD-10-CM | POA: Diagnosis not present

## 2016-01-18 DIAGNOSIS — J3081 Allergic rhinitis due to animal (cat) (dog) hair and dander: Secondary | ICD-10-CM | POA: Diagnosis not present

## 2016-01-18 DIAGNOSIS — J301 Allergic rhinitis due to pollen: Secondary | ICD-10-CM | POA: Diagnosis not present

## 2016-01-18 DIAGNOSIS — J3089 Other allergic rhinitis: Secondary | ICD-10-CM | POA: Diagnosis not present

## 2016-01-18 HISTORY — DX: Other seasonal allergic rhinitis: J30.2

## 2016-01-18 NOTE — ED Triage Notes (Signed)
Pt reports that his ears bilaterally have felt "clogged". Reports decreased hearing. He reports URI/ allergies s/s recently. Denies fever or pain.

## 2016-01-18 NOTE — ED Provider Notes (Signed)
[name]    CSN: [id number] Arrival date & time: 01/18/16  1651  First Provider Contact:  First MD Initiated Contact with Patient 01/18/16 1738        History   Chief Complaint Chief Complaint  Patient presents with  . Ear Fullness    HPI [name] is a 60 y.o. male.   Patient complains of his ears feeling clogged for about 3 weeks, with decrease in hearing. No pain or fever.  He notes that he had a URI recently.      Past Medical History:  Diagnosis Date  . Atrial fibrillation ([place])    history  . Cancer ([place])   . Kidney stones   . Seasonal allergies     There are no active problems to display for this patient.   Past Surgical History:  Procedure Laterality Date  . BASAL CELL CARCINOMA EXCISION    . Nasal biopsy         Home Medications    Prior to Admission medications   Medication Sig Start Date End Date Taking? Authorizing Provider  UNABLE TO FIND Med Name: allergy injections q wk   Yes Historical Provider, MD    Family History Family History  Problem Relation Age of Onset  . Cancer Father   . Cancer Mother     bladder    Social History Social History  Substance Use Topics  . Smoking status: Former Research scientist (life sciences)  . Smokeless tobacco: Never Used  . Alcohol use No     Allergies   Penicillins and Vegetable laxative [psyllium]   Review of Systems Review of Systems No sore throat No cough No pleuritic pain No wheezing No nasal congestion No post-nasal drainage No sinus pain/pressure No itchy/red eyes ? Earache; ears feel clogged No hemoptysis No SOB No fever/chills No nausea No vomiting No abdominal pain No diarrhea No urinary symptoms No skin rash No fatigue No myalgias No headache    Physical Exam Triage Vital Signs ED Triage Vitals  Enc Vitals Group     BP 01/18/16 1727 146/85     Pulse Rate 01/18/16 1727 83     Resp 01/18/16 1727 16     Temp 01/18/16 1727 97.9 F (36.6 C)     Temp Source  01/18/16 1727 Oral     SpO2 01/18/16 1727 97 %     Weight 01/18/16 1728 212 lb (96.2 kg)     Height 01/18/16 1728 5\' 9"  (1.753 m)     Head Circumference --      Peak Flow --      Pain Score 01/18/16 1732 0     Pain Loc --      Pain Edu? --      Excl. in [place]? --    No data found.   Updated Vital Signs BP 146/85 (BP Location: Left Arm)   Pulse 83   Temp 97.9 F (36.6 C) (Oral)   Resp 16   Ht 5\' 9"  (1.753 m)   Wt 212 lb (96.2 kg)   SpO2 97%   BMI 31.31 kg/m   Visual Acuity Right Eye Distance:   Left Eye Distance:   Bilateral Distance:    Right Eye Near:   Left Eye Near:    Bilateral Near:     Physical Exam Nursing notes and Vital Signs reviewed. Appearance:  Patient appears stated age, and in no acute distress Eyes:  Pupils are equal, round, and reactive to light and accomodation.  Extraocular  movement is intact.  Conjunctivae are not inflamed  Ears:  Canals occluded with cerumen bilaterally.  Post lavage, canals normal.  Tympanic membranes normal.  Nose:   Normal.  No sinus tenderness.   Pharynx:  Normal Neck:  Supple.  No adenopathy.      UC Treatments / Results  Labs (all labs ordered are listed, but only abnormal results are displayed) Labs Reviewed - No data to display  EKG  EKG Interpretation None       Radiology No results found.  Procedures Procedures  Bilateral ear lavage by nurse.  Medications Ordered in UC Medications - No data to display   Initial Impression / Assessment and Plan / UC Course  I have reviewed the triage vital signs and the nursing notes.  Pertinent labs & imaging results that were available during my care of the patient were reviewed by me and considered in my medical decision making (see chart for details).  Clinical Course  Call if ear drainage or discomfort develops     Final Clinical Impressions(s) / UC Diagnoses   Final diagnoses:  Bilateral impacted cerumen    New Prescriptions New Prescriptions   No  medications on file     Kandra Nicolas, MD 01/26/16 1255

## 2016-01-23 DIAGNOSIS — J3089 Other allergic rhinitis: Secondary | ICD-10-CM | POA: Diagnosis not present

## 2016-01-23 DIAGNOSIS — J301 Allergic rhinitis due to pollen: Secondary | ICD-10-CM | POA: Diagnosis not present

## 2016-01-23 DIAGNOSIS — J3081 Allergic rhinitis due to animal (cat) (dog) hair and dander: Secondary | ICD-10-CM | POA: Diagnosis not present

## 2016-01-30 DIAGNOSIS — J301 Allergic rhinitis due to pollen: Secondary | ICD-10-CM | POA: Diagnosis not present

## 2016-01-30 DIAGNOSIS — J3089 Other allergic rhinitis: Secondary | ICD-10-CM | POA: Diagnosis not present

## 2016-01-30 DIAGNOSIS — J3081 Allergic rhinitis due to animal (cat) (dog) hair and dander: Secondary | ICD-10-CM | POA: Diagnosis not present

## 2016-02-06 DIAGNOSIS — J3081 Allergic rhinitis due to animal (cat) (dog) hair and dander: Secondary | ICD-10-CM | POA: Diagnosis not present

## 2016-02-06 DIAGNOSIS — J301 Allergic rhinitis due to pollen: Secondary | ICD-10-CM | POA: Diagnosis not present

## 2016-02-06 DIAGNOSIS — J3089 Other allergic rhinitis: Secondary | ICD-10-CM | POA: Diagnosis not present

## 2016-02-13 DIAGNOSIS — J3089 Other allergic rhinitis: Secondary | ICD-10-CM | POA: Diagnosis not present

## 2016-02-13 DIAGNOSIS — J3081 Allergic rhinitis due to animal (cat) (dog) hair and dander: Secondary | ICD-10-CM | POA: Diagnosis not present

## 2016-02-13 DIAGNOSIS — J301 Allergic rhinitis due to pollen: Secondary | ICD-10-CM | POA: Diagnosis not present

## 2016-02-20 DIAGNOSIS — J301 Allergic rhinitis due to pollen: Secondary | ICD-10-CM | POA: Diagnosis not present

## 2016-02-20 DIAGNOSIS — J3089 Other allergic rhinitis: Secondary | ICD-10-CM | POA: Diagnosis not present

## 2016-02-20 DIAGNOSIS — J3081 Allergic rhinitis due to animal (cat) (dog) hair and dander: Secondary | ICD-10-CM | POA: Diagnosis not present

## 2016-02-27 DIAGNOSIS — J301 Allergic rhinitis due to pollen: Secondary | ICD-10-CM | POA: Diagnosis not present

## 2016-02-27 DIAGNOSIS — J3081 Allergic rhinitis due to animal (cat) (dog) hair and dander: Secondary | ICD-10-CM | POA: Diagnosis not present

## 2016-02-27 DIAGNOSIS — J3089 Other allergic rhinitis: Secondary | ICD-10-CM | POA: Diagnosis not present

## 2016-03-02 ENCOUNTER — Encounter: Payer: Self-pay | Admitting: *Deleted

## 2016-03-02 ENCOUNTER — Emergency Department
Admission: EM | Admit: 2016-03-02 | Discharge: 2016-03-02 | Disposition: A | Discharge status: Home / Self Care | Payer: 59 | Source: Home / Self Care | Attending: Family Medicine | Admitting: Family Medicine

## 2016-03-02 DIAGNOSIS — J069 Acute upper respiratory infection, unspecified: Secondary | ICD-10-CM

## 2016-03-02 DIAGNOSIS — B309 Viral conjunctivitis, unspecified: Secondary | ICD-10-CM

## 2016-03-02 DIAGNOSIS — B9789 Other viral agents as the cause of diseases classified elsewhere: Secondary | ICD-10-CM

## 2016-03-02 DIAGNOSIS — K4090 Unilateral inguinal hernia, without obstruction or gangrene, not specified as recurrent: Secondary | ICD-10-CM

## 2016-03-02 DIAGNOSIS — J3089 Other allergic rhinitis: Secondary | ICD-10-CM

## 2016-03-02 MED ORDER — PREDNISONE 20 MG PO TABS: ORAL_TABLET | ORAL | 0 refills | Status: DC

## 2016-03-02 MED ORDER — KETOROLAC TROMETHAMINE 0.5 % OP SOLN: 1.0000 [drp] | Freq: Four times a day (QID) | OPHTHALMIC | 0 refills | Status: DC

## 2016-03-02 MED ORDER — AZITHROMYCIN 250 MG PO TABS: ORAL_TABLET | ORAL | 0 refills | Status: DC

## 2016-03-02 MED FILL — predniSONE 20 MG TABS: 20 | 8 days supply | Qty: 13 | Fill #0

## 2016-03-02 MED FILL — KETOROLAC 0.5% OPHTH SOLN: 0.5 | 13 days supply | Qty: 5 | Fill #0

## 2016-03-02 MED FILL — MONTELUKAST SOD 10 MG TAB: 10 | 90 days supply | Qty: 90 | Fill #1

## 2016-03-02 NOTE — ED Provider Notes (Signed)
[name]    CSN: [id number] Arrival date & time: 03/02/16  [id number]     History   Chief Complaint Chief Complaint  Patient presents with  . Eye Problem    HPI [name] is a 60 y.o. male.   Patient presents with two problems: 1)  Yesterday his left eye became red with itching and drainage but no foreign body sensation.  Today he awoke with his right eye also red.  He has developed a mild sore throat, sinus congestion, and cough today.  He recalls that he has been fatigued during the past 3 days. He has a history of perennial rhinitis and takes Claritin and Flonase. 2)  Patient has had a recurring bulge in his left inguinal area for about two years that is gradually becoming larger.  It is not painful and tends to appear when he stands or strains.   The history is provided by the patient.    Past Medical History:  Diagnosis Date  . Atrial fibrillation ([place])    history  . Cancer ([place])   . Kidney stones   . Seasonal allergies     There are no active problems to display for this patient.   Past Surgical History:  Procedure Laterality Date  . BASAL CELL CARCINOMA EXCISION    . Nasal biopsy         Home Medications    Prior to Admission medications   Medication Sig Start Date End Date Taking? Authorizing Provider  azithromycin (ZITHROMAX Z-PAK) 250 MG tablet Take 2 tabs today; then begin one tab once daily for 4 more days. (Rx void after 03/10/16) 03/02/16   Kandra Nicolas, MD  ketorolac (ACULAR) 0.5 % ophthalmic solution Place 1 drop into both eyes 4 (four) times daily. Use as needed for eye redness and allergy symptoms. 03/02/16   Kandra Nicolas, MD  predniSONE (DELTASONE) 20 MG tablet Take one tab by mouth twice daily for 5 days, then one daily for 3 days. Take with food. 03/02/16   Kandra Nicolas, MD  UNABLE TO FIND Med Name: allergy injections q wk    Historical Provider, MD    Family History Family History  Problem Relation Age of  Onset  . Cancer Father   . Cancer Mother     bladder    Social History Social History  Substance Use Topics  . Smoking status: Former Research scientist (life sciences)  . Smokeless tobacco: Never Used  . Alcohol use No     Allergies   Penicillins and Vegetable laxative [psyllium]   Review of Systems Review of Systems + sore throat + cough No pleuritic pain No wheezing + nasal congestion + post-nasal drainage No sinus pain/pressure + itchy/red eyes No earache No hemoptysis No SOB No fever, + chills No nausea No vomiting No abdominal pain No diarrhea No urinary symptoms, but complains of bulge in his left inguinal area. No skin rash + fatigue + myalgias No headache    Physical Exam Triage Vital Signs ED Triage Vitals  Enc Vitals Group     BP 03/02/16 0855 151/90     Pulse Rate 03/02/16 0855 82     Resp 03/02/16 0855 16     Temp 03/02/16 0855 97.9 F (36.6 C)     Temp Source 03/02/16 0855 Oral     SpO2 03/02/16 0855 98 %     Weight 03/02/16 0855 212 lb (96.2 kg)     Height 03/02/16 0855 5\' 9"  (1.753 m)  Head Circumference --      Peak Flow --      Pain Score 03/02/16 0856 0     Pain Loc --      Pain Edu? --      Excl. in [place]? --    No data found.   Updated Vital Signs BP 151/90 (BP Location: Left Arm)   Pulse 82   Temp 97.9 F (36.6 C) (Oral)   Resp 16   Ht 5\' 9"  (1.753 m)   Wt 212 lb (96.2 kg)   SpO2 98%   BMI 31.31 kg/m   Visual Acuity Right Eye Distance: 20/20 Left Eye Distance: 20/20 Bilateral Distance: 20/20 (w/ glasses)  Right Eye Near:   Left Eye Near:    Bilateral Near:     Physical Exam Nursing notes and Vital Signs reviewed. Appearance:  Patient appears stated age, and in no acute distress Eyes:  Pupils are equal, round, and reactive to light and accomodation.  Extraocular movement is intact.  Conjunctivae are mildly injected bilaterally, more pronounced on the left.  No purulent discharge.  No photophobia.  No lid tenderness or  swelling. Ears:  Canals normal.  Tympanic membranes normal.  Nose:  Mildly congested turbinates.  No sinus tenderness.    Pharynx:  Uvula mildly swollen. Neck:  Supple.  Tender enlarged posterior/lateral nodes are palpated bilaterally  Lungs:  Clear to auscultation.  Breath sounds are equal.  Moving air well. Heart:  Regular rate and rhythm without murmurs, rubs, or gallops.  Abdomen:  Nontender without masses or hepatosplenomegaly.  Bowel sounds are present.  No CVA or flank tenderness.  Extremities:  No edema.  Skin:  No rash present.  Genitourinary:  Penis normal without lesions or urethral discharge.  Scrotum is normal.  Testes are descended bilaterally without nodules or tenderness.  A reducible left indirect inguinal hernia is palpated.  No regional lymphadenopathy palpated    UC Treatments / Results  Labs (all labs ordered are listed, but only abnormal results are displayed) Labs Reviewed - No data to display  EKG  EKG Interpretation None       Radiology No results found.  Procedures Procedures (including critical care time)  Medications Ordered in UC Medications - No data to display   Initial Impression / Assessment and Plan / UC Course  I have reviewed the triage vital signs and the nursing notes.  Pertinent labs & imaging results that were available during my care of the patient were reviewed by me and considered in my medical decision making (see chart for details).  Clinical Course  Begin prednisone burst/taper.  Begin Acular ophth suspension. As cold symptoms develop, take plain guaifenesin (1200mg  extended release tabs such as Mucinex) twice daily, with plenty of water, for cough and congestion.  May add Pseudoephedrine (30mg , one or two every 4 to 6 hours) for sinus congestion.  Get adequate rest.   May use Afrin nasal spray (or generic oxymetazoline) twice daily for about 5 days and then discontinue.  Also recommend using saline nasal spray several times  daily and saline nasal irrigation (AYR is a common brand).  Use Flonase nasal spray each morning after using Afrin nasal spray and saline nasal irrigation. Try warm salt water gargles for sore throat.  Stop all antihistamines for now, and other non-prescription cough/cold preparations. May take Delsym Cough Suppressant at bedtime for nighttime cough.  Begin Azithromycin if not improving about one week or if persistent fever develops (Given a prescription to hold,  with an expiration date)  Follow-up with family doctor if not improving about10 days.  Followup with general surgeon for evaluation/repair of left inguinal hernia.    Final Clinical Impressions(s) / UC Diagnoses   Final diagnoses:  Viral URI with cough  Perennial allergic rhinitis  Viral conjunctivitis, both eyes  Inguinal hernia, left    New Prescriptions New Prescriptions   AZITHROMYCIN (ZITHROMAX Z-PAK) 250 MG TABLET    Take 2 tabs today; then begin one tab once daily for 4 more days. (Rx void after 03/10/16)   KETOROLAC (ACULAR) 0.5 % OPHTHALMIC SOLUTION    Place 1 drop into both eyes 4 (four) times daily. Use as needed for eye redness and allergy symptoms.   PREDNISONE (DELTASONE) 20 MG TABLET    Take one tab by mouth twice daily for 5 days, then one daily for 3 days. Take with food.     Kandra Nicolas, MD 03/08/16 [phone]

## 2016-03-02 NOTE — Discharge Instructions (Signed)
As cold symptoms develop, take plain guaifenesin (1200mg  extended release tabs such as Mucinex) twice daily, with plenty of water, for cough and congestion.  May add Pseudoephedrine (30mg , one or two every 4 to 6 hours) for sinus congestion.  Get adequate rest.   May use Afrin nasal spray (or generic oxymetazoline) twice daily for about 5 days and then discontinue.  Also recommend using saline nasal spray several times daily and saline nasal irrigation (AYR is a common brand).  Use Flonase nasal spray each morning after using Afrin nasal spray and saline nasal irrigation. Try warm salt water gargles for sore throat.  Stop all antihistamines for now, and other non-prescription cough/cold preparations. May take Delsym Cough Suppressant at bedtime for nighttime cough.  Begin Azithromycin if not improving about one week or if persistent fever develops (Given a prescription to hold, with an expiration date)  Follow-up with family doctor if not improving about10 days.

## 2016-03-02 NOTE — ED Triage Notes (Signed)
Pt c/o bilateral eye redness, itching, and drainage x yesterday afternoon.

## 2016-03-05 DIAGNOSIS — J3089 Other allergic rhinitis: Secondary | ICD-10-CM | POA: Diagnosis not present

## 2016-03-05 DIAGNOSIS — J301 Allergic rhinitis due to pollen: Secondary | ICD-10-CM | POA: Diagnosis not present

## 2016-03-07 DIAGNOSIS — H1045 Other chronic allergic conjunctivitis: Secondary | ICD-10-CM | POA: Diagnosis not present

## 2016-03-07 DIAGNOSIS — J301 Allergic rhinitis due to pollen: Secondary | ICD-10-CM | POA: Diagnosis not present

## 2016-03-07 DIAGNOSIS — J3089 Other allergic rhinitis: Secondary | ICD-10-CM | POA: Diagnosis not present

## 2016-03-07 DIAGNOSIS — J4520 Mild intermittent asthma, uncomplicated: Secondary | ICD-10-CM | POA: Diagnosis not present

## 2016-03-12 DIAGNOSIS — J3089 Other allergic rhinitis: Secondary | ICD-10-CM | POA: Diagnosis not present

## 2016-03-12 DIAGNOSIS — J301 Allergic rhinitis due to pollen: Secondary | ICD-10-CM | POA: Diagnosis not present

## 2016-03-12 DIAGNOSIS — J3081 Allergic rhinitis due to animal (cat) (dog) hair and dander: Secondary | ICD-10-CM | POA: Diagnosis not present

## 2016-03-14 DIAGNOSIS — J329 Chronic sinusitis, unspecified: Secondary | ICD-10-CM

## 2016-03-14 HISTORY — DX: Chronic sinusitis, unspecified: J32.9

## 2016-03-19 DIAGNOSIS — J301 Allergic rhinitis due to pollen: Secondary | ICD-10-CM | POA: Diagnosis not present

## 2016-03-19 DIAGNOSIS — J3081 Allergic rhinitis due to animal (cat) (dog) hair and dander: Secondary | ICD-10-CM | POA: Diagnosis not present

## 2016-03-19 DIAGNOSIS — J3089 Other allergic rhinitis: Secondary | ICD-10-CM | POA: Diagnosis not present

## 2016-03-20 DIAGNOSIS — J301 Allergic rhinitis due to pollen: Secondary | ICD-10-CM | POA: Diagnosis not present

## 2016-03-21 DIAGNOSIS — J3089 Other allergic rhinitis: Secondary | ICD-10-CM | POA: Diagnosis not present

## 2016-03-21 DIAGNOSIS — J3081 Allergic rhinitis due to animal (cat) (dog) hair and dander: Secondary | ICD-10-CM | POA: Diagnosis not present

## 2016-03-26 DIAGNOSIS — J301 Allergic rhinitis due to pollen: Secondary | ICD-10-CM | POA: Diagnosis not present

## 2016-03-26 DIAGNOSIS — J3089 Other allergic rhinitis: Secondary | ICD-10-CM | POA: Diagnosis not present

## 2016-03-26 DIAGNOSIS — J3081 Allergic rhinitis due to animal (cat) (dog) hair and dander: Secondary | ICD-10-CM | POA: Diagnosis not present

## 2016-04-02 DIAGNOSIS — J3081 Allergic rhinitis due to animal (cat) (dog) hair and dander: Secondary | ICD-10-CM | POA: Diagnosis not present

## 2016-04-02 DIAGNOSIS — J301 Allergic rhinitis due to pollen: Secondary | ICD-10-CM | POA: Diagnosis not present

## 2016-04-02 DIAGNOSIS — J3089 Other allergic rhinitis: Secondary | ICD-10-CM | POA: Diagnosis not present

## 2016-04-09 DIAGNOSIS — J3089 Other allergic rhinitis: Secondary | ICD-10-CM | POA: Diagnosis not present

## 2016-04-09 DIAGNOSIS — J3081 Allergic rhinitis due to animal (cat) (dog) hair and dander: Secondary | ICD-10-CM | POA: Diagnosis not present

## 2016-04-09 DIAGNOSIS — J301 Allergic rhinitis due to pollen: Secondary | ICD-10-CM | POA: Diagnosis not present

## 2016-04-16 DIAGNOSIS — J3081 Allergic rhinitis due to animal (cat) (dog) hair and dander: Secondary | ICD-10-CM | POA: Diagnosis not present

## 2016-04-16 DIAGNOSIS — J3089 Other allergic rhinitis: Secondary | ICD-10-CM | POA: Diagnosis not present

## 2016-04-16 DIAGNOSIS — J301 Allergic rhinitis due to pollen: Secondary | ICD-10-CM | POA: Diagnosis not present

## 2016-04-19 ENCOUNTER — Ambulatory Visit: Payer: Self-pay | Admitting: General Surgery

## 2016-04-19 DIAGNOSIS — K4090 Unilateral inguinal hernia, without obstruction or gangrene, not specified as recurrent: Secondary | ICD-10-CM | POA: Diagnosis not present

## 2016-04-20 MED FILL — TAMSULOSIN HCL 0.4 MG CAP: 0.4 | 30 days supply | Qty: 30 | Fill #0

## 2016-04-23 DIAGNOSIS — J3089 Other allergic rhinitis: Secondary | ICD-10-CM | POA: Diagnosis not present

## 2016-04-23 DIAGNOSIS — J3081 Allergic rhinitis due to animal (cat) (dog) hair and dander: Secondary | ICD-10-CM | POA: Diagnosis not present

## 2016-04-23 DIAGNOSIS — J301 Allergic rhinitis due to pollen: Secondary | ICD-10-CM | POA: Diagnosis not present

## 2016-04-23 MED FILL — AZELASTINE 0.15% NASAL SPRY: 0.15 | 90 days supply | Qty: 90 | Fill #0

## 2016-04-23 MED FILL — VERAMYST 27.5 MCG NASAL SPR: 27.5 | 90 days supply | Qty: 30 | Fill #0

## 2016-04-30 DIAGNOSIS — J3089 Other allergic rhinitis: Secondary | ICD-10-CM | POA: Diagnosis not present

## 2016-04-30 DIAGNOSIS — J3081 Allergic rhinitis due to animal (cat) (dog) hair and dander: Secondary | ICD-10-CM | POA: Diagnosis not present

## 2016-04-30 DIAGNOSIS — J301 Allergic rhinitis due to pollen: Secondary | ICD-10-CM | POA: Diagnosis not present

## 2016-05-08 DIAGNOSIS — J3089 Other allergic rhinitis: Secondary | ICD-10-CM | POA: Diagnosis not present

## 2016-05-08 DIAGNOSIS — J3081 Allergic rhinitis due to animal (cat) (dog) hair and dander: Secondary | ICD-10-CM | POA: Diagnosis not present

## 2016-05-08 DIAGNOSIS — J301 Allergic rhinitis due to pollen: Secondary | ICD-10-CM | POA: Diagnosis not present

## 2016-05-10 NOTE — Patient Instructions (Addendum)
[name]  05/10/2016   Your procedure is scheduled on: Thursday 05/17/2016  Report to [facility]  Entrance take [place]  elevators to 3rd floor to  [place] at 1230 PM.  Call this number if you have problems the morning of surgery [phone]   Remember: ONLY 1 PERSON MAY GO WITH YOU TO SHORT STAY TO GET  READY MORNING OF [place].  Do not eat food  :After Midnight Wednesday night. Clear liquids from midnight until 0930am day of surgery. Nothing by mouth after 0930am day of surgery.     Take these medicines the morning of surgery with A SIP OF WATER: loratadine(Claritin), ranitidine, Veramyst nasal spray                                You may not have any metal on your body including hair pins and              piercings  Do not wear jewelry, make-up, lotions, powders or perfumes, deodorant             Do not wear nail polish.  Do not shave  48 hours prior to surgery.              Men may shave face and neck.   Do not bring valuables to the hospital. [place].  Contacts, dentures or bridgework may not be worn into surgery.  Leave suitcase in the car. After surgery it may be brought to your room.     Patients discharged the day of surgery will not be allowed to drive home.  Name and phone number of your driver:wife sandra cell [phone]  Special Instructions: N/A              Please read over the following fact sheets you were given: _____________________________________________________________________     CLEAR LIQUID DIET   Foods Allowed                                                                     Foods Excluded  Coffee and tea, regular and decaf                             liquids that you cannot  Plain Jell-O in any flavor                                             see through such as: Fruit ices (not with fruit pulp)                                     milk,  soups, orange juice  Iced Popsicles  All solid food Carbonated beverages, regular and diet                                    Cranberry, grape and apple juices Sports drinks like Gatorade Lightly seasoned clear broth or consume(fat free) Sugar, honey syrup  Sample Menu Breakfast                                Lunch                                     Supper Cranberry juice                    Beef broth                            Chicken broth Jell-O                                     Grape juice                           Apple juice Coffee or tea                        Jell-O                                      Popsicle                                                Coffee or tea                        Coffee or tea  _____________________________________________________________________             [facility] - Preparing for Surgery Before surgery, you can play an important role.  Because skin is not sterile, your skin needs to be as free of germs as possible.  You can reduce the number of germs on your skin by washing with CHG (chlorahexidine gluconate) soap before surgery.  CHG is an antiseptic cleaner which kills germs and bonds with the skin to continue killing germs even after washing. Please DO NOT use if you have an allergy to CHG or antibacterial soaps.  If your skin becomes reddened/irritated stop using the CHG and inform your nurse when you arrive at Short Stay. Do not shave (including legs and underarms) for at least 48 hours prior to the first CHG shower.  You may shave your face/neck. Please follow these instructions carefully:  1.  Shower with CHG Soap the night before surgery and the  morning of Surgery.  2.  If you choose to wash your hair, wash your hair first as usual with your  normal  shampoo.  3.  After you shampoo, rinse your hair and body thoroughly to remove the  shampoo.  4.  Use CHG as you would any  other liquid soap.  You can apply chg directly  to the skin and wash                       Gently with a scrungie or clean washcloth.  5.  Apply the CHG Soap to your body ONLY FROM THE NECK DOWN.   Do not use on face/ open                           Wound or open sores. Avoid contact with eyes, ears mouth and genitals (private parts).                       Wash face,  Genitals (private parts) with your normal soap.             6.  Wash thoroughly, paying special attention to the area where your surgery  will be performed.  7.  Thoroughly rinse your body with warm water from the neck down.  8.  DO NOT shower/wash with your normal soap after using and rinsing off  the CHG Soap.                9.  Pat yourself dry with a clean towel.            10.  Wear clean pajamas.            11.  Place clean sheets on your bed the night of your first shower and do not  sleep with pets. Day of Surgery : Do not apply any lotions/deodorants the morning of surgery.  Please wear clean clothes to the hospital/surgery center.  FAILURE TO FOLLOW THESE INSTRUCTIONS MAY RESULT IN THE CANCELLATION OF YOUR SURGERY PATIENT SIGNATURE_________________________________  NURSE SIGNATURE__________________________________  ________________________________________________________________________

## 2016-05-11 ENCOUNTER — Encounter (HOSPITAL_COMMUNITY)
Admission: RE | Admit: 2016-05-11 | Discharge: 2016-05-11 | Disposition: A | Discharge status: Home / Self Care | Payer: 59 | Source: Ambulatory Visit | Attending: General Surgery | Admitting: General Surgery

## 2016-05-11 ENCOUNTER — Encounter (HOSPITAL_COMMUNITY): Payer: Self-pay

## 2016-05-11 DIAGNOSIS — Z01812 Encounter for preprocedural laboratory examination: Secondary | ICD-10-CM | POA: Diagnosis not present

## 2016-05-11 DIAGNOSIS — Z01810 Encounter for preprocedural cardiovascular examination: Secondary | ICD-10-CM | POA: Diagnosis not present

## 2016-05-11 HISTORY — DX: Unilateral inguinal hernia, without obstruction or gangrene, not specified as recurrent: K40.90

## 2016-05-11 HISTORY — DX: Chronic sinusitis, unspecified: J32.9

## 2016-05-11 HISTORY — DX: Family history of other specified conditions: Z84.89

## 2016-05-11 HISTORY — DX: Personal history of urinary calculi: Z87.442

## 2016-05-11 LAB — BASIC METABOLIC PANEL
ANION GAP: 6 (ref 5–15)
BUN: 20 mg/dL (ref 6–20)
CHLORIDE: 108 mmol/L (ref 101–111)
CO2: 25 mmol/L (ref 22–32)
Calcium: 9 mg/dL (ref 8.9–10.3)
Creatinine, Ser: 0.94 mg/dL (ref 0.61–1.24)
GFR calc Af Amer: >60 mL/min (ref >60)
Glucose, Bld: 99 mg/dL (ref 65–99)
POTASSIUM: 4.3 mmol/L (ref 3.5–5.1)
SODIUM: 139 mmol/L (ref 135–145)

## 2016-05-11 LAB — CBC
HCT: 46.1 % (ref 39.0–52.0)
Hemoglobin: 16.3 g/dL (ref 13.0–17.0)
MCH: 30.1 pg (ref 26.0–34.0)
MCHC: 35.4 g/dL (ref 30.0–36.0)
MCV: 85.2 fL (ref 78.0–100.0)
PLATELETS: 251 10*3/uL (ref 150–400)
RBC: 5.41 MIL/uL (ref 4.22–5.81)
RDW: 12.2 % (ref 11.5–15.5)
WBC: 6.2 10*3/uL (ref 4.0–10.5)

## 2016-05-17 ENCOUNTER — Ambulatory Visit (HOSPITAL_COMMUNITY): Payer: 59 | Admitting: Anesthesiology

## 2016-05-17 ENCOUNTER — Encounter (HOSPITAL_COMMUNITY): Payer: Self-pay | Admitting: *Deleted

## 2016-05-17 ENCOUNTER — Ambulatory Visit (HOSPITAL_COMMUNITY)
Admission: RE | Admit: 2016-05-17 | Discharge: 2016-05-17 | Disposition: A | Discharge status: Home / Self Care | Payer: 59 | Source: Ambulatory Visit | Attending: General Surgery | Admitting: General Surgery

## 2016-05-17 ENCOUNTER — Encounter (HOSPITAL_COMMUNITY)
Admission: RE | Disposition: A | Discharge status: Home / Self Care | Payer: Self-pay | Source: Ambulatory Visit | Attending: General Surgery

## 2016-05-17 DIAGNOSIS — K219 Gastro-esophageal reflux disease without esophagitis: Secondary | ICD-10-CM | POA: Diagnosis not present

## 2016-05-17 DIAGNOSIS — Z85820 Personal history of malignant melanoma of skin: Secondary | ICD-10-CM | POA: Insufficient documentation

## 2016-05-17 DIAGNOSIS — K4020 Bilateral inguinal hernia, without obstruction or gangrene, not specified as recurrent: Secondary | ICD-10-CM | POA: Diagnosis not present

## 2016-05-17 DIAGNOSIS — Z79899 Other long term (current) drug therapy: Secondary | ICD-10-CM | POA: Insufficient documentation

## 2016-05-17 DIAGNOSIS — Z87891 Personal history of nicotine dependence: Secondary | ICD-10-CM | POA: Insufficient documentation

## 2016-05-17 DIAGNOSIS — K4090 Unilateral inguinal hernia, without obstruction or gangrene, not specified as recurrent: Secondary | ICD-10-CM | POA: Diagnosis not present

## 2016-05-17 HISTORY — PX: INGUINAL HERNIA REPAIR: SHX194

## 2016-05-17 HISTORY — PX: INSERTION OF MESH: SHX5868

## 2016-05-17 SURGERY — REPAIR, HERNIA, INGUINAL, LAPAROSCOPIC
Anesthesia: General | Site: Groin | Laterality: Bilateral

## 2016-05-17 MED ORDER — LACTATED RINGERS IR SOLN
Status: DC | PRN
  Administered 2016-05-17: 3000 mL

## 2016-05-17 MED ORDER — SUGAMMADEX SODIUM 200 MG/2ML IV SOLN
INTRAVENOUS | Status: DC | PRN
  Administered 2016-05-17: 200 mg via INTRAVENOUS

## 2016-05-17 MED ORDER — LACTATED RINGERS IV SOLN
INTRAVENOUS | Status: DC
  Administered 2016-05-17: 14:00:00 via INTRAVENOUS

## 2016-05-17 MED ORDER — ACETAMINOPHEN 500 MG PO TABS
1000.0000 mg | ORAL_TABLET | ORAL | Status: AC
  Administered 2016-05-17: 1000 mg via ORAL
  Filled 2016-05-17: qty 2

## 2016-05-17 MED ORDER — ACETAMINOPHEN 650 MG RE SUPP: 650.0000 mg | RECTAL | Status: DC | PRN

## 2016-05-17 MED ORDER — LIDOCAINE 2% (20 MG/ML) 5 ML SYRINGE
INTRAMUSCULAR | Status: DC | PRN
  Administered 2016-05-17: 100 mg via INTRAVENOUS

## 2016-05-17 MED ORDER — SUGAMMADEX SODIUM 200 MG/2ML IV SOLN
INTRAVENOUS | Status: AC
  Filled 2016-05-17: qty 2

## 2016-05-17 MED ORDER — PHENYLEPHRINE 40 MCG/ML (10ML) SYRINGE FOR IV PUSH (FOR BLOOD PRESSURE SUPPORT)
PREFILLED_SYRINGE | INTRAVENOUS | Status: DC | PRN
  Administered 2016-05-17 (×2): 80 ug via INTRAVENOUS

## 2016-05-17 MED ORDER — ONDANSETRON HCL 4 MG/2ML IJ SOLN
INTRAMUSCULAR | Status: AC
  Filled 2016-05-17: qty 2

## 2016-05-17 MED ORDER — PHENYLEPHRINE 40 MCG/ML (10ML) SYRINGE FOR IV PUSH (FOR BLOOD PRESSURE SUPPORT)
PREFILLED_SYRINGE | INTRAVENOUS | Status: AC
  Filled 2016-05-17: qty 10

## 2016-05-17 MED ORDER — SODIUM CHLORIDE 0.9 % IV SOLN: 250.0000 mL | INTRAVENOUS | Status: DC | PRN

## 2016-05-17 MED ORDER — BUPIVACAINE HCL (PF) 0.25 % IJ SOLN
INTRAMUSCULAR | Status: AC
  Filled 2016-05-17: qty 30

## 2016-05-17 MED ORDER — SODIUM CHLORIDE 0.9% FLUSH: 3.0000 mL | Freq: Two times a day (BID) | INTRAVENOUS | Status: DC

## 2016-05-17 MED ORDER — MIDAZOLAM HCL 5 MG/5ML IJ SOLN
INTRAMUSCULAR | Status: DC | PRN
  Administered 2016-05-17: 2 mg via INTRAVENOUS

## 2016-05-17 MED ORDER — SODIUM CHLORIDE 0.9% FLUSH: 3.0000 mL | INTRAVENOUS | Status: DC | PRN

## 2016-05-17 MED ORDER — ACETAMINOPHEN 325 MG PO TABS: 650.0000 mg | ORAL_TABLET | ORAL | Status: DC | PRN

## 2016-05-17 MED ORDER — DEXAMETHASONE SODIUM PHOSPHATE 10 MG/ML IJ SOLN
INTRAMUSCULAR | Status: DC | PRN
  Administered 2016-05-17: 10 mg via INTRAVENOUS

## 2016-05-17 MED ORDER — LACTATED RINGERS IV SOLN: INTRAVENOUS | Status: DC

## 2016-05-17 MED ORDER — ROCURONIUM BROMIDE 10 MG/ML (PF) SYRINGE
PREFILLED_SYRINGE | INTRAVENOUS | Status: DC | PRN
  Administered 2016-05-17: 10 mg via INTRAVENOUS
  Administered 2016-05-17: 50 mg via INTRAVENOUS
  Administered 2016-05-17: 10 mg via INTRAVENOUS
  Administered 2016-05-17: 20 mg via INTRAVENOUS
  Administered 2016-05-17: 5 mg via INTRAVENOUS

## 2016-05-17 MED ORDER — OXYCODONE HCL 5 MG PO TABS: 5.0000 mg | ORAL_TABLET | ORAL | 0 refills | Status: DC | PRN

## 2016-05-17 MED ORDER — METOCLOPRAMIDE HCL 5 MG/ML IJ SOLN: 10.0000 mg | INTRAMUSCULAR | Status: DC | PRN

## 2016-05-17 MED ORDER — 0.9 % SODIUM CHLORIDE (POUR BTL) OPTIME
TOPICAL | Status: DC | PRN
  Administered 2016-05-17: 1000 mL

## 2016-05-17 MED ORDER — DEXAMETHASONE SODIUM PHOSPHATE 10 MG/ML IJ SOLN
INTRAMUSCULAR | Status: AC
  Filled 2016-05-17: qty 1

## 2016-05-17 MED ORDER — ROCURONIUM BROMIDE 50 MG/5ML IV SOSY
PREFILLED_SYRINGE | INTRAVENOUS | Status: AC
  Filled 2016-05-17: qty 5

## 2016-05-17 MED ORDER — KETOROLAC TROMETHAMINE 30 MG/ML IJ SOLN
30.0000 mg | Freq: Once | INTRAMUSCULAR | Status: AC | PRN
  Administered 2016-05-17: 30 mg via INTRAVENOUS

## 2016-05-17 MED ORDER — FENTANYL CITRATE (PF) 100 MCG/2ML IJ SOLN: 25.0000 ug | INTRAMUSCULAR | Status: DC | PRN

## 2016-05-17 MED ORDER — FENTANYL CITRATE (PF) 100 MCG/2ML IJ SOLN
INTRAMUSCULAR | Status: DC | PRN
  Administered 2016-05-17 (×2): 50 ug via INTRAVENOUS
  Administered 2016-05-17: 100 ug via INTRAVENOUS
  Administered 2016-05-17: 50 ug via INTRAVENOUS

## 2016-05-17 MED ORDER — FENTANYL CITRATE (PF) 250 MCG/5ML IJ SOLN
INTRAMUSCULAR | Status: AC
  Filled 2016-05-17: qty 5

## 2016-05-17 MED ORDER — OXYCODONE HCL 5 MG PO TABS
ORAL_TABLET | ORAL | Status: AC
  Filled 2016-05-17: qty 1

## 2016-05-17 MED ORDER — PROPOFOL 10 MG/ML IV BOLUS
INTRAVENOUS | Status: DC | PRN
  Administered 2016-05-17: 200 mg via INTRAVENOUS

## 2016-05-17 MED ORDER — PROMETHAZINE HCL 25 MG/ML IJ SOLN
6.2500 mg | INTRAMUSCULAR | Status: AC
  Administered 2016-05-17: 6.25 mg via INTRAVENOUS
  Filled 2016-05-17: qty 1

## 2016-05-17 MED ORDER — GABAPENTIN 300 MG PO CAPS
300.0000 mg | ORAL_CAPSULE | ORAL | Status: AC
  Administered 2016-05-17: 300 mg via ORAL
  Filled 2016-05-17: qty 1

## 2016-05-17 MED ORDER — OXYCODONE HCL 5 MG PO TABS
5.0000 mg | ORAL_TABLET | Freq: Once | ORAL | Status: AC | PRN
  Administered 2016-05-17: 5 mg via ORAL

## 2016-05-17 MED ORDER — MIDAZOLAM HCL 2 MG/2ML IJ SOLN
INTRAMUSCULAR | Status: AC
Start: 2016-05-17 — End: 2016-05-17
  Filled 2016-05-17: qty 2

## 2016-05-17 MED ORDER — PROMETHAZINE HCL 25 MG/ML IJ SOLN: 6.2500 mg | INTRAMUSCULAR | Status: DC | PRN

## 2016-05-17 MED ORDER — BUPIVACAINE HCL (PF) 0.25 % IJ SOLN
INTRAMUSCULAR | Status: DC | PRN
  Administered 2016-05-17: 50 mL

## 2016-05-17 MED ORDER — OXYCODONE HCL 5 MG PO TABS
5.0000 mg | ORAL_TABLET | ORAL | Status: DC | PRN
  Filled 2016-05-17: qty 1

## 2016-05-17 MED ORDER — ACETAMINOPHEN 500 MG PO TABS
1000.0000 mg | ORAL_TABLET | Freq: Four times a day (QID) | ORAL | Status: DC
  Filled 2016-05-17 (×4): qty 2

## 2016-05-17 MED ORDER — KETOROLAC TROMETHAMINE 30 MG/ML IJ SOLN
INTRAMUSCULAR | Status: AC
  Filled 2016-05-17: qty 1

## 2016-05-17 MED ORDER — CELECOXIB 200 MG PO CAPS
400.0000 mg | ORAL_CAPSULE | ORAL | Status: AC
  Administered 2016-05-17: 400 mg via ORAL
  Filled 2016-05-17: qty 2

## 2016-05-17 MED ORDER — VANCOMYCIN HCL 10 G IV SOLR
1500.0000 mg | INTRAVENOUS | Status: AC
  Administered 2016-05-17: 1500 mg via INTRAVENOUS
  Filled 2016-05-17: qty 1500

## 2016-05-17 MED ORDER — OXYCODONE HCL 5 MG/5ML PO SOLN: 5.0000 mg | Freq: Once | ORAL | Status: AC | PRN

## 2016-05-17 MED ORDER — ONDANSETRON HCL 4 MG/2ML IJ SOLN
INTRAMUSCULAR | Status: DC | PRN
  Administered 2016-05-17: 4 mg via INTRAVENOUS

## 2016-05-17 SURGICAL SUPPLY — 51 items
ADH SKN CLS APL DERMABOND .7 (GAUZE/BANDAGES/DRESSINGS)
APL SKNCLS STERI-STRIP NONHPOA (GAUZE/BANDAGES/DRESSINGS) ×2
APPLIER CLIP 5 13 M/L LIGAMAX5 (MISCELLANEOUS)
APR CLP MED LRG 5 ANG JAW (MISCELLANEOUS)
BANDAGE ADH SHEER 1  50/CT (GAUZE/BANDAGES/DRESSINGS) ×4 IMPLANT
BENZOIN TINCTURE PRP APPL 2/3 (GAUZE/BANDAGES/DRESSINGS) ×1 IMPLANT
CABLE HIGH FREQUENCY MONO STRZ (ELECTRODE) ×3 IMPLANT
CHLORAPREP W/TINT 26ML (MISCELLANEOUS) ×3 IMPLANT
CLIP APPLIE 5 13 M/L LIGAMAX5 (MISCELLANEOUS) IMPLANT
COVER SURGICAL LIGHT HANDLE (MISCELLANEOUS) ×2 IMPLANT
DECANTER SPIKE VIAL GLASS SM (MISCELLANEOUS) ×2 IMPLANT
DERMABOND ADVANCED (GAUZE/BANDAGES/DRESSINGS)
DERMABOND ADVANCED .7 DNX12 (GAUZE/BANDAGES/DRESSINGS) ×2 IMPLANT
DEVICE SECURE STRAP 25 ABSORB (INSTRUMENTS) ×1 IMPLANT
DRSG TEGADERM 2-3/8X2-3/4 SM (GAUZE/BANDAGES/DRESSINGS) ×3 IMPLANT
DRSG TEGADERM 4X4.75 (GAUZE/BANDAGES/DRESSINGS) ×1 IMPLANT
ELECT PENCIL ROCKER SW 15FT (MISCELLANEOUS) ×1 IMPLANT
ELECT REM PT RETURN 9FT ADLT (ELECTROSURGICAL) ×3
ELECTRODE REM PT RTRN 9FT ADLT (ELECTROSURGICAL) ×2 IMPLANT
GAUZE SPONGE 2X2 8PLY STRL LF (GAUZE/BANDAGES/DRESSINGS) IMPLANT
GLOVE BIO SURGEON STRL SZ7.5 (GLOVE) ×3 IMPLANT
GLOVE INDICATOR 8.0 STRL GRN (GLOVE) ×3 IMPLANT
GOWN STRL REUS W/TWL XL LVL3 (GOWN DISPOSABLE) ×9 IMPLANT
IRRIG SUCT STRYKERFLOW 2 WTIP (MISCELLANEOUS)
IRRIGATION SUCT STRKRFLW 2 WTP (MISCELLANEOUS) IMPLANT
KIT BASIN OR (CUSTOM PROCEDURE TRAY) ×3 IMPLANT
L-HOOK LAP DISP 36CM (ELECTROSURGICAL)
LHOOK LAP DISP 36CM (ELECTROSURGICAL) IMPLANT
MESH 3DMAX 4X6 LT LRG (Mesh General) ×3 IMPLANT
MESH 3DMAX 4X6 RT LRG (Mesh General) ×3 IMPLANT
RELOAD STAPLE 4.0 BLU F/HERNIA (INSTRUMENTS) IMPLANT
RELOAD STAPLE 4.8 BLK F/HERNIA (STAPLE) IMPLANT
RELOAD STAPLE HERNIA 4.0 BLUE (INSTRUMENTS) IMPLANT
RELOAD STAPLE HERNIA 4.8 BLK (STAPLE) IMPLANT
SCISSORS LAP 5X35 DISP (ENDOMECHANICALS) ×3 IMPLANT
SET IRRIG TUBING LAPAROSCOPIC (IRRIGATION / IRRIGATOR) ×1 IMPLANT
SLEEVE XCEL OPT CAN 5 100 (ENDOMECHANICALS) ×3 IMPLANT
SPONGE GAUZE 2X2 STER 10/PKG (GAUZE/BANDAGES/DRESSINGS) ×1
STAPLER HERNIA 12 8.5 360D (INSTRUMENTS) IMPLANT
STRIP CLOSURE SKIN 1/2X4 (GAUZE/BANDAGES/DRESSINGS) ×3 IMPLANT
SUT MNCRL AB 4-0 PS2 18 (SUTURE) ×4 IMPLANT
SUT NOVA NAB DX-16 0-1 5-0 T12 (SUTURE) IMPLANT
SUT NOVA NAB GS-21 0 18 T12 DT (SUTURE) IMPLANT
SUT VIC AB 3-0 SH 18 (SUTURE) ×1 IMPLANT
SUT VICRYL 0 UR6 27IN ABS (SUTURE) ×1 IMPLANT
TOWEL OR 17X26 10 PK STRL BLUE (TOWEL DISPOSABLE) ×3 IMPLANT
TOWEL OR NON WOVEN STRL DISP B (DISPOSABLE) ×3 IMPLANT
TRAY LAPAROSCOPIC (CUSTOM PROCEDURE TRAY) ×3 IMPLANT
TROCAR BLADELESS OPT 5 100 (ENDOMECHANICALS) ×3 IMPLANT
TROCAR XCEL BLUNT TIP 100MML (ENDOMECHANICALS) ×3 IMPLANT
TUBING INSUF HEATED (TUBING) IMPLANT

## 2016-05-17 NOTE — Anesthesia Preprocedure Evaluation (Signed)
Anesthesia Evaluation  Patient identified by MRN, date of birth, ID band Patient awake    Reviewed: Allergy & Precautions, NPO status , Patient's Chart, lab work & pertinent test results  Airway Mallampati: II  TM Distance: >3 FB Neck ROM: Full    Dental no notable dental hx.    Pulmonary neg pulmonary ROS, former smoker,    Pulmonary exam normal breath sounds clear to auscultation       Cardiovascular negative cardio ROS Normal cardiovascular exam Rhythm:Regular Rate:Normal     Neuro/Psych negative neurological ROS  negative psych ROS   GI/Hepatic negative GI ROS, Neg liver ROS,   Endo/Other  negative endocrine ROS  Renal/GU negative Renal ROS  negative genitourinary   Musculoskeletal negative musculoskeletal ROS (+)   Abdominal   Peds negative pediatric ROS (+)  Hematology negative hematology ROS (+)   Anesthesia Other Findings   Reproductive/Obstetrics negative OB ROS                             Anesthesia Physical Anesthesia Plan  ASA: II  Anesthesia Plan: General   Post-op Pain Management:    Induction: Intravenous  Airway Management Planned: Oral ETT  Additional Equipment:   Intra-op Plan:   Post-operative Plan: Extubation in OR  Informed Consent: I have reviewed the patients History and Physical, chart, labs and discussed the procedure including the risks, benefits and alternatives for the proposed anesthesia with the patient or authorized representative who has indicated his/her understanding and acceptance.   Dental advisory given  Plan Discussed with: CRNA and Surgeon  Anesthesia Plan Comments:         Anesthesia Quick Evaluation  

## 2016-05-17 NOTE — Progress Notes (Signed)
Patient arrives to short stay. Eating crackers and drinking gingerale. Patient becomes very pale in color and vomits.  Called Dr Kalman Shan and received orders for antiemetics.

## 2016-05-17 NOTE — Op Note (Signed)
05/17/2016  [name] [birth year]   PREOPERATIVE DIAGNOSIS: Left inguinal hernia inguinal hernia.   POSTOPERATIVE DIAGNOSIS: Left indirect inguinal hernia, right direct inguinal hernia  PROCEDURE: Laparoscopic repair of bilateral inguinal hernias with  mesh (TAPP).   SURGEON: Leighton Ruff. Redmond Pulling, MD   ASSISTANT: Lenord Carbo PA-S  ANESTHESIA: General plus local consisting of 0.25% Marcaine with epi.   ESTIMATED BLOOD LOSS: Minimal.   FINDINGS: The patient had a left indirect hernia. Upon inspection of the contralateral going he was found to have a defect medial to the inferior epigastric vessels on the right consistent with a right direct defect. I did not necessarily appreciate this in the clinic. I contacted the patient's wife in the waiting room and discussed the intraoperative findings. We discussed repair of the right sided hernia today at the same time since a contralateral hernia was discovered and we could repair it through the same 3 abdominal wall incisions and during the same anesthetic. The patient's wife gave verbal consent to proceed with repairing of the contralateral side.  It was repaired using a a large piece of left Bard 3-D max mesh and a large piece of right Bard 3-D max mesh   SPECIMEN: None  INDICATIONS FOR PROCEDURE: 61 year old gentleman presented with a symptomatic left inguinal hernia. He desired surgical intervention. We discussed the pros and cons of repair along with a laparoscopic approach. He elected proceed to the operating room for repair of a left inguinal hernia. The risks and benefits including but not limited to bleeding, infection, chronic inguinal pain, nerve entrapment, hernia recurrence, mesh complications, hematoma formation, urinary retention, injury to the testicle, numbness in the groin, blood clots, injury to the surrounding structures, and anesthesia risk was discussed with the patient.  DESCRIPTION OF PROCEDURE: After obtaining verbal  consent and marking  the left groin in the holding area with the patient confirming the  operative site, the patient was then taken back to the operating room, placed  supine on the operating room table. General endotracheal anesthesia was  established. The patient had emptied their bladder prior to going back to  the operating room. Sequential compression devices were placed. The  abdomen and groin were prepped and draped in the usual standard surgical  fashion with ChloraPrep. The patient received  IV  antibiotics prior to the incision. A surgical time-out was performed.  Local was infiltrated at the base of the umbilicus.   Next, a 3-cm transverse infraumbilical incision was made with a #11 blade. Dissection was carried down to the abdominal fascia. He had a small umbilical fascial defect so therefore I decided to use the pre-existing small umbilical fascial defect. The fascia  was grasped and lifted anteriorly. Next, the fascia was incised, and  the abdominal cavity was entered. Pursestring suture was placed around  the fascial edges using a 0 Vicryl. A 12-mm Hasson trocar was placed.  Pneumoperitoneum was smoothly established up to a patient pressure of 15  mmHg. Laparoscope was advanced. There was evidence of a  contralateral hernia. The patient had a defect lateral to  the inferior epigastric vessel, consistent with an left indirect inguinal  hernia. On the contralateral side the patient had a defect medial to the inferior epigastric vessels consistent with the direct defect. At this point I contacted the patient's wife and discussed repair please see discussion above. Two 5-mm trocars were placed, one on the right, one on the left  in the midclavicular line slightly above the level of the umbilicus  all  under direct visualization. After local had been infiltrated, I then  made incision along the peritoneum on the left, starting 2 inches above  the anterior superior iliac spine and  caring it medial  toward the median umbilical ligament in a lazy S configuration using  Endo Shears with electrocautery. The peritoneal flap was then gently  dissected downward from the anterior abdominal wall taking care not to  injure the inferior epigastric vessels. The pubic bone was identified.  The testicular vessels were identified.  Using  traction and counter traction with short graspers, I reduced the sac in  its entirety. The testicular vessels had been identified and preserved. The vas deferens was identified and preserved, and the hernia sac was stripped from those to  surrounding structures. I then went about creating a large pocket by  lifting the peritoneum of the pelvic floor. I took great care not to  injure the iliac vessels.   Local anesthetic was injected 2 finger breadths below and medial to the anterior superior iliac spine as well as along the left groin prior to placing the mesh. I then obtained a large piece of left Bard 3-D max mesh placed it through the Hasson trocar, half of it covered medial  to the inferior epigastric vessels and half of it lateral to the  inferior epigastric vessels. The defect was well  covered with the mesh.   I then switched sides with the camera holder and started work on the right side. A peritoneal flap was created on the right side in a similar fashion by incising the peritoneum with EndoShears and caring it medially toward the median umbilical ligament. The peritoneal flap was dissected down from the anterior abdominal wall taking care not to injure the inferior epigastric vessel. The direct defect was reduced with blunt dissection. I then went about creating a large pocket to accommodate mesh on the right side. The peritoneum was stripped off the pelvic floor structures. The testicular vessels and vas deferens was identified and the peritoneum was lifted off the structures. I then infiltrated local into the lower abdominal wall in a similar  fashion as I done on the previous side. I then obtained a large piece of Bard right 3-D Max mesh and placed it into the groin on the right side. Half of the mesh was medial to the inferior epigastric vessels and half lateral.  I then secured the right mesh to the abdominal wall  using an Ethicon secure strap tack. Tacks were placed through  the Cooper's ligament (1), one tack on each side of the inferior epigastric  vessel. No tacks were placed below the  shelving edge of the inguinal ligament. In a similar fashion I secured the mesh on the left side. A. tack was placed through the mesh into Cooper's ligament, one superior medially, and one just lateral to the inferior epigastric vessels along the superior edge of the mesh. Pneumoperitoneum was reduced  to 8 mmHg. I then brought the peritoneal flap back up to the abdominal  wall and tacked it to the abdominal wall using 3 tacks on each side. There was no  defect in the peritoneum, and the mesh was well covered. I removed the  Hasson trocar and tied down the previously placed pursestring suture. I placed an additional interrupted 0 Vicryl suture at the umbilical fascia. The closure was viewed laparoscopically. There was no evidence of  fascial defect. There was no air leak at the umbilicus. There was no  evidence of injury to surrounding structures. Pneumoperitoneum was  released, and the remaining trocars were removed. All skin incisions  were closed with a 4-0 Monocryl in a subcuticular fashion followed by  application of benzoin, Steri-Strips and sterile bandages.. All needle, instrument, and sponge counts  were correct x2. There are no immediate complications. The patient  tolerated the procedure well. The patient was extubated and taken to the  recovery room in stable condition.

## 2016-05-17 NOTE — Interval H&P Note (Signed)
History and Physical Interval Note:  05/17/2016 3:16 PM  [name]  has presented today for surgery, with the diagnosis of left inguinal hernia  The various methods of treatment have been discussed with the patient and family. After consideration of risks, benefits and other options for treatment, the patient has consented to  Procedure(s): [place] (Left) INSERTION OF MESH (Left) as a surgical intervention .  The patient's history has been reviewed, patient examined, no change in status, stable for surgery.  I have reviewed the patient's chart and labs.  Questions were answered to the patient's satisfaction.    Leighton Ruff. Redmond Pulling, MD, [place], Bariatric, & Minimally Invasive Surgery [facility] Surgery, [place]   [facility] M

## 2016-05-17 NOTE — H&P (Signed)
[name] 04/19/2016 9:00 AM Location: [facility] Patient #: [id number] DOB: June 30, 1955 Married / Language: [name] / Race: White Male   History of Present Illness Jose Hiss M. [name] Pace; 04/19/2016 9:34 AM) The patient is a 61 year old male who presents with an inguinal hernia. He is referred by Dr. Assunta Found for evaluation of a left inguinal hernia. He states he has had it for one to 2 years. He is seeking care now because it has gotten larger. He also reports an issue about 3 weeks ago when he was getting down decorations and had a twinge of discomfort and then coughed and had sharp pain in his left groin. Other than that he really does not have any sharp, burning, or stinging discomfort. He will have an occasional ache. He denies any fever, chills, nausea or vomiting. He does have some nocturia and weak stream. He denies any prior abdominal surgery. He does not smoke.   Problem List/Past Medical Jose Hiss [name], Pace; 04/19/2016 9:35 AM) LEFT INGUINAL HERNIA (K40.90)   Other Problems [name]; 04/19/2016 9:35 AM) Gastroesophageal Reflux Disease  Inguinal Hernia  Kidney Stone  Melanoma   Past Surgical History [name]; 04/19/2016 9:00 AM) Colon Polyp Removal - Colonoscopy   Diagnostic Studies History [name]; 04/19/2016 9:00 AM) Colonoscopy  1-5 years ago  Allergies [name]; 04/19/2016 9:03 AM) Penicillins  Anaphylaxis, Hives.  Medication History [name]; 04/19/2016 9:35 AM) [name] (250MG  Tablet, Oral daily) Active. Medications Reconciled Flomax (0.4MG  Capsule, 1 (one) Capsule Oral at bedtime, Taken starting 04/19/2016) Active. (start taking 2 weeks before hernia surgery)  Social History [name]; 04/19/2016 9:00 AM) Alcohol use  Occasional alcohol use. Caffeine use  Carbonated beverages, Tea. No drug use  Tobacco use  Former smoker.  Family History [name]; 04/19/2016  9:00 AM) Melanoma  Father, Mother.    Review of Systems [name] Pace; 04/19/2016 9:00 AM) General Not Present- Appetite Loss, Chills, Fatigue, Fever, Night Sweats, Weight Gain and Weight Loss. Skin Not Present- Change in Wart/Mole, Dryness, Hives, Jaundice, New Lesions, Non-Healing Wounds, Rash and Ulcer. HEENT Present- Seasonal Allergies. Not Present- Earache, Hearing Loss, Hoarseness, Nose Bleed, Oral Ulcers, Ringing in the Ears, Sinus Pain, Sore Throat, Visual Disturbances, Wears glasses/contact lenses and Yellow Eyes. Respiratory Present- Snoring. Not Present- Bloody sputum, Chronic Cough, Difficulty Breathing and Wheezing. Breast Not Present- Breast Mass, Breast Pain, Nipple Discharge and Skin Changes. Cardiovascular Not Present- Chest Pain, Difficulty Breathing Lying Down, Leg Cramps, Palpitations, Rapid Heart Rate, Shortness of Breath and Swelling of Extremities. Gastrointestinal Not Present- Abdominal Pain, Bloating, Bloody Stool, Change in Bowel Habits, Chronic diarrhea, Constipation, Difficulty Swallowing, Excessive gas, Gets full quickly at meals, Hemorrhoids, Indigestion, Nausea, Rectal Pain and Vomiting. Male Genitourinary Not Present- Blood in Urine, Change in Urinary Stream, Frequency, Impotence, Nocturia, Painful Urination, Urgency and Urine Leakage. Musculoskeletal Not Present- Back Pain, Joint Pain, Joint Stiffness, Muscle Pain, Muscle Weakness and Swelling of Extremities. Neurological Not Present- Decreased Memory, Fainting, Headaches, Numbness, Seizures, Tingling, Tremor, Trouble walking and Weakness. Psychiatric Not Present- Anxiety, Bipolar, Change in Sleep Pattern, Depression, Fearful and Frequent crying. Endocrine Not Present- Cold Intolerance, Excessive Hunger, Hair Changes, Heat Intolerance, Hot flashes and New Diabetes. Hematology Not Present- Blood Thinners, Easy Bruising, Excessive bleeding, Gland problems, HIV and Persistent Infections.  Vitals [name]; 04/19/2016 9:04 AM) 04/19/2016 9:04 AM Weight: 211 lb Height: 69in Body Surface Area: 2.11 m Body Mass Index: 31.16  kg/m  Temp.: 97.56F  Pulse: 88 (Regular)        Physical Exam Jose Hiss M. Jose Duthie Pace; 04/19/2016 9:32 AM) General Mental Status-Alert. General Appearance-Consistent with stated age. Hydration-Well hydrated. Voice-Normal.  Head and Neck Head-normocephalic, atraumatic with no lesions or palpable masses. Trachea-midline. Thyroid Gland Characteristics - normal size and consistency.  Eye Eyeball - Bilateral-Extraocular movements intact. Sclera/Conjunctiva - Bilateral-No scleral icterus.  Chest and Lung Exam Chest and lung exam reveals -quiet, even and easy respiratory effort with no use of accessory muscles and on auscultation, normal breath sounds, no adventitious sounds and normal vocal resonance. Inspection Chest Wall - Normal. Back - normal.  Breast - Did not examine.  Cardiovascular Cardiovascular examination reveals -normal heart sounds, regular rate and rhythm with no murmurs and normal pedal pulses bilaterally.  Abdomen Inspection Inspection of the abdomen reveals - No Hernias. Skin - Scar - no surgical scars. Palpation/Percussion Palpation and Percussion of the abdomen reveal - Soft, Non Tender, No Rebound tenderness, No Rigidity (guarding) and No hepatosplenomegaly. Auscultation Auscultation of the abdomen reveals - Bowel sounds normal.  Male Genitourinary Note: Patient examined supine and standing with and without Valsalva maneuvers. Obvious left groin bulge, easily reducible, nontender. Both testicles descended. No scrotal or testicular masses. No evidence of right inguinal hernia with Valsalva   Peripheral Vascular Upper Extremity Palpation - Pulses bilaterally normal.  Neurologic Neurologic evaluation reveals -alert and oriented x 3 with no impairment of recent or remote memory. Mental  Status-Normal.  Neuropsychiatric The patient's mood and affect are described as -normal. Judgment and Insight-insight is appropriate concerning matters relevant to self.  Musculoskeletal Normal Exam - Left-Upper Extremity Strength Normal and Lower Extremity Strength Normal. Normal Exam - Right-Upper Extremity Strength Normal and Lower Extremity Strength Normal.  Lymphatic Head & Neck  General Head & Neck Lymphatics: Bilateral - Description - Normal. Axillary - Did not examine. Femoral & Inguinal - Did not examine.    Assessment & Plan Jose Hiss M. Rohnan Bartleson Pace; 04/19/2016 9:35 AM) LEFT INGUINAL HERNIA (K40.90) Impression: We discussed the etiology of inguinal hernias. We discussed the signs & symptoms of incarceration & strangulation. We discussed non-operative and operative management. We discussed both open and laparoscopic approaches I recommended a laparoscopic approach  The patient has elected to proceed with laparoscopic repair of left inguinal hernia with mesh   I described the procedure in detail. The patient was given educational material. We discussed the risks and benefits including but not limited to bleeding, infection, chronic inguinal pain, nerve entrapment, hernia recurrence, mesh complications, hematoma formation, urinary retention, injury to the testicle, numbness in the groin, blood clots, injury to the surrounding structures, and anesthesia risk. We also discussed the typical post operative recovery course, including no heavy lifting for 4-6 weeks. I explained that the likelihood of improvement of their symptoms is good Current Plans You are being scheduled for surgery - Our schedulers will call you.  You should hear from our office's scheduling department within 5 working days about the location, date, and time of surgery. We try to make accommodations for patient's preferences in scheduling surgery, but sometimes the OR schedule or the surgeon's schedule prevents  [place] from making those accommodations.  If you have not heard from our office [phone]) in 5 working days, call the office and ask for your surgeon's nurse.  If you have other questions about your diagnosis, plan, or surgery, call the office and ask for your surgeon's nurse.  Pt Education - Pamphlet Given - Laparoscopic Hernia  Repair: discussed with patient and provided information. Started Flomax 0.4MG , 1 (one) Capsule at bedtime, #30, 30 days starting 04/19/2016, No Refill. Local Order: start taking 2 weeks before hernia surgery  Leighton Ruff. Redmond Pulling, Pace, FACS General, Bariatric, & Minimally Invasive Surgery [facility], [place]

## 2016-05-17 NOTE — Anesthesia Procedure Notes (Signed)
Procedure Name: Intubation Date/Time: 05/17/2016 3:33 PM Performed by: Noralyn Pick D Pre-anesthesia Checklist: Patient identified, Emergency Drugs available, Suction available and Patient being monitored Patient Re-evaluated:Patient Re-evaluated prior to inductionOxygen Delivery Method: Circle system utilized Preoxygenation: Pre-oxygenation with 100% oxygen Intubation Type: IV induction Ventilation: Mask ventilation without difficulty Laryngoscope Size: Mac and 4 Grade View: Grade I Tube type: Oral Tube size: 7.5 mm Number of attempts: 1 Airway Equipment and Method: Stylet Placement Confirmation: ETT inserted through vocal cords under direct vision,  positive ETCO2 and breath sounds checked- equal and bilateral Secured at: 22 cm Tube secured with: Tape Dental Injury: Teeth and Oropharynx as per pre-operative assessment

## 2016-05-17 NOTE — Progress Notes (Signed)
  May 17, 2016  Patient: [name]  Date of Birth: Mar 17, 1956  Date of Visit: 04/20/2016    To Whom It May Concern:  Malacki Venier was seen and treated in our Short Stay for surgery. Please allow him to wear less formal clothing to work on 05/21/16 for comfort.   Sincerely,  Gaynelle Cage RN [phone]

## 2016-05-17 NOTE — Transfer of Care (Signed)
Immediate Anesthesia Transfer of Care Note  Patient: [name]  Procedure(s) Performed: Procedure(s): LAPAROSCOPIC REPAIR OF BILATERAL  INGUINAL HERNIA (Bilateral) INSERTION OF MESH (Bilateral)  Patient Location: PACU  Anesthesia Type:General  Level of Consciousness: awake, alert  and oriented  Airway & Oxygen Therapy: Patient Spontanous Breathing and Patient connected to face mask oxygen  Post-op Assessment: Report given to RN and Post -op Vital signs reviewed and stable  Post vital signs: Reviewed and stable  Last Vitals:  Vitals:   05/17/16 1259  BP: 130/84  Pulse: 97  Resp: 18  Temp: 36.4 C    Last Pain:  Vitals:   05/17/16 1259  TempSrc: Oral      Patients Stated Pain Goal: 3 (0000000 [id number])  Complications: No apparent anesthesia complications

## 2016-05-17 NOTE — Discharge Instructions (Signed)
General Anesthesia, Adult, Care After These instructions provide you with information about caring for yourself after your procedure. Your health care provider may also give you more specific instructions. Your treatment has been planned according to current medical practices, but problems sometimes occur. Call your health care provider if you have any problems or questions after your procedure. What can I expect after the procedure? After the procedure, it is common to have:  Vomiting.  A sore throat.  Mental slowness. It is common to feel:  Nauseous.  Cold or shivery.  Sleepy.  Tired.  Sore or achy, even in parts of your body where you did not have surgery. Follow these instructions at home: For at least 24 hours after the procedure:  Do not:  Participate in activities where you could fall or become injured.  Drive.  Use heavy machinery.  Drink alcohol.  Take sleeping pills or medicines that cause drowsiness.  Make important decisions or sign legal documents.  Take care of children on your own.  Rest. Eating and drinking  If you vomit, drink water, juice, or soup when you can drink without vomiting.  Drink enough fluid to keep your urine clear or pale yellow.  Make sure you have little or no nausea before eating solid foods.  Follow the diet recommended by your health care provider. General instructions  Have a responsible adult stay with you until you are awake and alert.  Return to your normal activities as told by your health care provider. Ask your health care provider what activities are safe for you.  Take over-the-counter and prescription medicines only as told by your health care provider.  If you smoke, do not smoke without supervision.  Keep all follow-up visits as told by your health care provider. This is important. Contact a health care provider if:  You continue to have nausea or vomiting at home, and medicines are not helpful.  You  cannot drink fluids or start eating again.  You cannot urinate after 8-12 hours.  You develop a skin rash.  You have fever.  You have increasing redness at the site of your procedure. Get help right away if:  You have difficulty breathing.  You have chest pain.  You have unexpected bleeding.  You feel that you are having a life-threatening or urgent problem. This information is not intended to replace advice given to you by your health care provider. Make sure you discuss any questions you have with your health care provider. Document Released: 08/06/2000 Document Revised: 10/03/2015 Document Reviewed: 04/14/2015 Elsevier Interactive Patient Education  2017 [employer] [facility] INGUINAL HERNIA REPAIR: POST OP INSTRUCTIONS  Always review your discharge instruction sheet given to you by the facility where your surgery was performed. IF YOU HAVE DISABILITY OR FAMILY LEAVE FORMS, YOU MUST BRING THEM TO THE OFFICE FOR PROCESSING.   DO NOT GIVE THEM TO YOUR DOCTOR.  1. A  prescription for pain medication may be given to you upon discharge.  Take your pain medication as prescribed, if needed.  If narcotic pain medicine is not needed, then you may take acetaminophen (Tylenol) &/or ibuprofen (Advil) as needed. 2. Take your usually prescribed medications unless otherwise directed. 3. If you need a refill on your pain medication, please contact your pharmacy.  They will contact our office to request authorization. Prescriptions will not be filled after 5 pm or on week-ends. 4. You should follow a light diet the first 24 hours after arrival home,  such as soup and crackers, etc.  Be sure to include lots of fluids daily.  Resume your normal diet the day after surgery. 5. Most patients will experience some swelling and bruising around the umbilicus or in the groin and scrotum.  Ice packs and reclining will help.  Swelling and bruising can take several days to resolve.  6. It is common  to experience some constipation if taking pain medication after surgery.  Increasing fluid intake and taking a stool softener (such as Colace) will usually help or prevent this problem from occurring.  A mild laxative (Milk of Magnesia or Miralax) should be taken according to package directions if there are no bowel movements after 48 hours. 7. Unless discharge instructions indicate otherwise, you may remove your bandages 48 hours after surgery, and you may shower at that time.  You have steri-strips (small skin tapes) in place directly over the incision.  These strips should be left on the skin for 7-10 days.  8. ACTIVITIES:  You may resume regular (light) daily activities beginning the next day--such as daily self-care, walking, climbing stairs--gradually increasing activities as tolerated.  You may have sexual intercourse when it is comfortable.  Refrain from any heavy lifting or straining until approved by your doctor. a. You may drive when you are no longer taking prescription pain medication, you can comfortably wear a seatbelt, and you can safely maneuver your car and apply brakes. b. RETURN TO WORK:  9. You should see your doctor in the office for a follow-up appointment approximately 2-3 weeks after your surgery.  Make sure that you call for this appointment within a day or two after you arrive home to insure a convenient appointment time. 10. OTHER INSTRUCTIONS: DO NOT LIFT/PUSH/PULL ANYTHING GREATER THAN 10 LBS FOR 6 WEEKS    WHEN TO CALL YOUR DOCTOR: 1. Fever over 101.0 2. Inability to urinate 3. Nausea and/or vomiting 4. Extreme swelling or bruising 5. Continued bleeding from incision. 6. Increased pain, redness, or drainage from the incision  The clinic staff is available to answer your questions during regular business hours.  Please dont hesitate to call and ask to speak to one of the nurses for clinical concerns.  If you have a medical emergency, go to the nearest emergency room or  call 911.  A surgeon from [facility] is always on call at the hospital   [address] [place], [place], [place]  [address] ?  [address], [place], [place]   [address] [phone] ? [phone] ? FAX [phone] Web site: [email or web address]

## 2016-05-18 ENCOUNTER — Encounter (HOSPITAL_COMMUNITY): Payer: Self-pay | Admitting: General Surgery

## 2016-05-21 NOTE — Anesthesia Postprocedure Evaluation (Addendum)
Anesthesia Post Note  Patient: [name]  Procedure(s) Performed: Procedure(s) (LRB): LAPAROSCOPIC REPAIR OF BILATERAL  INGUINAL HERNIA (Bilateral) INSERTION OF MESH (Bilateral)  Patient location during evaluation: PACU Anesthesia Type: General Level of consciousness: awake and alert Pain management: pain level controlled Vital Signs Assessment: post-procedure vital signs reviewed and stable Respiratory status: spontaneous breathing, nonlabored ventilation, respiratory function stable and patient connected to nasal cannula oxygen Cardiovascular status: blood pressure returned to baseline and stable Postop Assessment: no signs of nausea or vomiting Anesthetic complications: no       Last Vitals:  Vitals:   05/17/16 1825 05/17/16 1931  BP: 127/76 125/74  Pulse: 93 98  Resp: 16 16  Temp: 36.4 C 36.6 C    Last Pain:  Vitals:   05/17/16 1931  TempSrc: Oral  PainSc: 3                  Adiah Guereca S

## 2016-06-14 DIAGNOSIS — L821 Other seborrheic keratosis: Secondary | ICD-10-CM | POA: Diagnosis not present

## 2016-06-14 DIAGNOSIS — D225 Melanocytic nevi of trunk: Secondary | ICD-10-CM | POA: Diagnosis not present

## 2016-06-14 DIAGNOSIS — L570 Actinic keratosis: Secondary | ICD-10-CM | POA: Diagnosis not present

## 2016-06-14 DIAGNOSIS — L814 Other melanin hyperpigmentation: Secondary | ICD-10-CM | POA: Diagnosis not present

## 2016-06-14 DIAGNOSIS — Z23 Encounter for immunization: Secondary | ICD-10-CM | POA: Diagnosis not present

## 2016-06-14 DIAGNOSIS — Z85828 Personal history of other malignant neoplasm of skin: Secondary | ICD-10-CM | POA: Diagnosis not present

## 2016-06-14 DIAGNOSIS — D1801 Hemangioma of skin and subcutaneous tissue: Secondary | ICD-10-CM | POA: Diagnosis not present

## 2016-07-16 MED FILL — MONTELUKAST SOD 10 MG TAB: 10 | 90 days supply | Qty: 90 | Fill #0

## 2016-10-11 MED FILL — FLUTICASONE PROP 50 MCG SPR: 50 | 90 days supply | Qty: 48 | Fill #0

## 2016-10-15 NOTE — Addendum Note (Signed)
Addendum  created 10/15/16 0949 by Beata Beason, MD   Sign clinical note    

## 2016-12-31 MED FILL — MONTELUKAST SOD 10 MG TAB: 10 | 90 days supply | Qty: 90 | Fill #0

## 2017-03-08 DIAGNOSIS — J3089 Other allergic rhinitis: Secondary | ICD-10-CM | POA: Diagnosis not present

## 2017-03-08 DIAGNOSIS — J301 Allergic rhinitis due to pollen: Secondary | ICD-10-CM | POA: Diagnosis not present

## 2017-03-08 DIAGNOSIS — J4520 Mild intermittent asthma, uncomplicated: Secondary | ICD-10-CM | POA: Diagnosis not present

## 2017-03-08 DIAGNOSIS — H1045 Other chronic allergic conjunctivitis: Secondary | ICD-10-CM | POA: Diagnosis not present

## 2017-05-09 MED FILL — MONTELUKAST SOD 10 MG TAB: 10 | 90 days supply | Qty: 90 | Fill #0

## 2017-06-17 DIAGNOSIS — D225 Melanocytic nevi of trunk: Secondary | ICD-10-CM | POA: Diagnosis not present

## 2017-06-17 DIAGNOSIS — Z23 Encounter for immunization: Secondary | ICD-10-CM | POA: Diagnosis not present

## 2017-06-17 DIAGNOSIS — L409 Psoriasis, unspecified: Secondary | ICD-10-CM | POA: Diagnosis not present

## 2017-06-17 DIAGNOSIS — L570 Actinic keratosis: Secondary | ICD-10-CM | POA: Diagnosis not present

## 2017-06-17 DIAGNOSIS — D1801 Hemangioma of skin and subcutaneous tissue: Secondary | ICD-10-CM | POA: Diagnosis not present

## 2017-06-17 DIAGNOSIS — L814 Other melanin hyperpigmentation: Secondary | ICD-10-CM | POA: Diagnosis not present

## 2017-06-17 DIAGNOSIS — Z85828 Personal history of other malignant neoplasm of skin: Secondary | ICD-10-CM | POA: Diagnosis not present

## 2017-06-17 DIAGNOSIS — L821 Other seborrheic keratosis: Secondary | ICD-10-CM | POA: Diagnosis not present

## 2017-06-20 MED FILL — FLUTICASONE PROP 50 MCG SPR: 50 | 90 days supply | Qty: 48 | Fill #0

## 2017-06-20 MED FILL — AZELASTINE HCL 137 MCG SPRY: 0.1 | 25 days supply | Qty: 30 | Fill #0

## 2017-07-04 DIAGNOSIS — Z13220 Encounter for screening for lipoid disorders: Secondary | ICD-10-CM | POA: Diagnosis not present

## 2017-07-04 DIAGNOSIS — Z131 Encounter for screening for diabetes mellitus: Secondary | ICD-10-CM | POA: Diagnosis not present

## 2017-07-04 DIAGNOSIS — Z8679 Personal history of other diseases of the circulatory system: Secondary | ICD-10-CM | POA: Diagnosis not present

## 2017-07-04 DIAGNOSIS — Z125 Encounter for screening for malignant neoplasm of prostate: Secondary | ICD-10-CM | POA: Diagnosis not present

## 2017-07-04 DIAGNOSIS — Z0000 Encounter for general adult medical examination without abnormal findings: Secondary | ICD-10-CM | POA: Diagnosis not present

## 2017-09-10 DIAGNOSIS — C44519 Basal cell carcinoma of skin of other part of trunk: Secondary | ICD-10-CM | POA: Diagnosis not present

## 2017-09-10 DIAGNOSIS — L570 Actinic keratosis: Secondary | ICD-10-CM | POA: Diagnosis not present

## 2017-09-10 DIAGNOSIS — D485 Neoplasm of uncertain behavior of skin: Secondary | ICD-10-CM | POA: Diagnosis not present

## 2017-10-08 MED FILL — MONTELUKAST SOD 10 MG TAB: 10 | 90 days supply | Qty: 90 | Fill #1

## 2017-10-31 DIAGNOSIS — C44519 Basal cell carcinoma of skin of other part of trunk: Secondary | ICD-10-CM | POA: Diagnosis not present

## 2018-01-16 MED FILL — AZELASTINE HCL 137 MCG/SPRA: 137 | 25 days supply | Qty: 30 | Fill #1

## 2018-01-16 MED FILL — MONTELUKAST SOD 10 MG TAB: 10 | 90 days supply | Qty: 90 | Fill #0

## 2018-01-16 MED FILL — FLUTICASONE PROP 50 MCG SPR: 50 | 90 days supply | Qty: 48 | Fill #1

## 2018-03-07 DIAGNOSIS — J3081 Allergic rhinitis due to animal (cat) (dog) hair and dander: Secondary | ICD-10-CM | POA: Diagnosis not present

## 2018-03-07 DIAGNOSIS — H1045 Other chronic allergic conjunctivitis: Secondary | ICD-10-CM | POA: Diagnosis not present

## 2018-03-07 DIAGNOSIS — J301 Allergic rhinitis due to pollen: Secondary | ICD-10-CM | POA: Diagnosis not present

## 2018-03-07 DIAGNOSIS — J3089 Other allergic rhinitis: Secondary | ICD-10-CM | POA: Diagnosis not present

## 2018-05-29 DIAGNOSIS — H524 Presbyopia: Secondary | ICD-10-CM | POA: Diagnosis not present

## 2018-06-25 DIAGNOSIS — L814 Other melanin hyperpigmentation: Secondary | ICD-10-CM | POA: Diagnosis not present

## 2018-06-25 DIAGNOSIS — D225 Melanocytic nevi of trunk: Secondary | ICD-10-CM | POA: Diagnosis not present

## 2018-06-25 DIAGNOSIS — Z23 Encounter for immunization: Secondary | ICD-10-CM | POA: Diagnosis not present

## 2018-06-25 DIAGNOSIS — L821 Other seborrheic keratosis: Secondary | ICD-10-CM | POA: Diagnosis not present

## 2018-06-25 DIAGNOSIS — L570 Actinic keratosis: Secondary | ICD-10-CM | POA: Diagnosis not present

## 2018-06-25 DIAGNOSIS — Z85828 Personal history of other malignant neoplasm of skin: Secondary | ICD-10-CM | POA: Diagnosis not present

## 2018-06-25 MED FILL — FLUTICASONE PROP 50 MCG SPR: 50 | 90 days supply | Qty: 48 | Fill #0

## 2018-06-25 MED FILL — FLUOROURACIL 5 % CREA: 5 | 42 days supply | Qty: 40 | Fill #0

## 2018-07-02 MED FILL — DEXAMETHASONE 4 MG TABLET: 4 | 3 days supply | Qty: 9 | Fill #0

## 2018-07-02 MED FILL — CLINDAMYCIN HCL 300 MG CAPS: 300 | 5 days supply | Qty: 15 | Fill #0

## 2018-08-12 DIAGNOSIS — Z125 Encounter for screening for malignant neoplasm of prostate: Secondary | ICD-10-CM | POA: Diagnosis not present

## 2018-08-12 DIAGNOSIS — Z131 Encounter for screening for diabetes mellitus: Secondary | ICD-10-CM | POA: Diagnosis not present

## 2018-08-12 DIAGNOSIS — Z13220 Encounter for screening for lipoid disorders: Secondary | ICD-10-CM | POA: Diagnosis not present

## 2018-08-12 DIAGNOSIS — I480 Paroxysmal atrial fibrillation: Secondary | ICD-10-CM | POA: Diagnosis not present

## 2018-08-12 DIAGNOSIS — Z0000 Encounter for general adult medical examination without abnormal findings: Secondary | ICD-10-CM | POA: Diagnosis not present

## 2018-08-19 MED FILL — MONTELUKAST SOD 10 MG TAB: 10 | 90 days supply | Qty: 90 | Fill #0

## 2018-09-23 DIAGNOSIS — L219 Seborrheic dermatitis, unspecified: Secondary | ICD-10-CM | POA: Diagnosis not present

## 2018-09-23 DIAGNOSIS — L570 Actinic keratosis: Secondary | ICD-10-CM | POA: Diagnosis not present

## 2018-09-23 MED FILL — FLUOCINONIDE 0.05 % SOLN: 0.05 | 30 days supply | Qty: 60 | Fill #0

## 2018-09-23 MED FILL — SULFACETAMIDE-SULFUR 10-5%: 10-5 | 30 days supply | Qty: 57 | Fill #0

## 2018-10-28 DIAGNOSIS — M79671 Pain in right foot: Secondary | ICD-10-CM | POA: Diagnosis not present

## 2018-10-28 DIAGNOSIS — M722 Plantar fascial fibromatosis: Secondary | ICD-10-CM | POA: Diagnosis not present

## 2018-11-10 MED FILL — MONTELUKAST SOD 10 MG TAB: 10 | 90 days supply | Qty: 90 | Fill #0

## 2018-12-25 MED FILL — SULFACETAMIDE-SULFUR 10-5%: 10-5 | 30 days supply | Qty: 57 | Fill #1

## 2019-03-25 ENCOUNTER — Emergency Department (INDEPENDENT_AMBULATORY_CARE_PROVIDER_SITE_OTHER)
Admission: EM | Admit: 2019-03-25 | Discharge: 2019-03-25 | Disposition: A | Discharge status: Home / Self Care | Payer: 59 | Source: Home / Self Care | Attending: Family Medicine | Admitting: Family Medicine

## 2019-03-25 ENCOUNTER — Other Ambulatory Visit: Payer: Self-pay

## 2019-03-25 DIAGNOSIS — H6123 Impacted cerumen, bilateral: Secondary | ICD-10-CM

## 2019-03-25 NOTE — Discharge Instructions (Addendum)
°  To prevent recurrent ear wax blockage, try the following: Soak two cotton balls with mineral oil, and gently place in each ear canal once weekly.  Leave the cotton balls in place for 10 to 20 minutes.  This will help liquefy the ear wax and aid your body's normal elimination process.  If applicable, do not use a hearing aid for 8 hours overnight.  Have your ears cleaned by a health professional every 6 to 12 months.  Avoid using "Q-tips" and ear wax softening solutions

## 2019-03-25 NOTE — ED Triage Notes (Signed)
Pt states that he has his ears flushed every so often, and has noticed for the last 2 weeks that the seem full.

## 2019-03-25 NOTE — ED Provider Notes (Signed)
[name]    CSN: [id number] Arrival date & time: 03/25/19  1721      History   Chief Complaint Chief Complaint  Patient presents with  . Ear Fullness    HPI BOLESLAW MARKOWITZ is a 63 y.o. male.   Patient wore ear plugs while working outside two weeks ago and his ears have felt full since then.    The history is provided by the patient.  Ear Fullness This is a recurrent problem. Episode onset: 2 weeks ago. The problem occurs constantly. The problem has not changed since onset.Nothing aggravates the symptoms. Nothing relieves the symptoms. He has tried nothing for the symptoms.    Past Medical History:  Diagnosis Date  . Atrial fibrillation ([place]) 2009   history  . Cancer [facility]) lsdy areas remobved from bnack june 2017   skin basal cell amd squamous cell areas remove from nose back and leg  . Family history of adverse reaction to anesthesia    mother hx ponv  . History of kidney stones 1980's   passed on own  . Left inguinal hernia   . Seasonal allergies   . Sinus infection 03/2016   clear now    There are no active problems to display for this patient.   Past Surgical History:  Procedure Laterality Date  . BASAL CELL CARCINOMA EXCISION    . HERNIA REPAIR    . INGUINAL HERNIA REPAIR Bilateral 05/17/2016   Procedure: LAPAROSCOPIC REPAIR OF BILATERAL  INGUINAL HERNIA;  Surgeon: Greer Pickerel, MD;  Location: [facility];  Service: General;  Laterality: Bilateral;  . INSERTION OF MESH Bilateral 05/17/2016   Procedure: INSERTION OF MESH;  Surgeon: Greer Pickerel, MD;  Location: [facility];  Service: General;  Laterality: Bilateral;  . Nasal biopsy         Home Medications    Prior to Admission medications   Medication Sig Start Date End Date Taking? Authorizing Provider  Azelastine HCl 0.15 % SOLN Place 1 spray into both nostrils at bedtime. 04/23/16   [provider]  docusate sodium (COLACE) 100 MG capsule Take 100 mg by mouth as needed for mild  constipation.    [provider]  ibuprofen (ADVIL,MOTRIN) 200 MG tablet Take 800 mg by mouth every 4 (four) hours as needed for headache or moderate pain.    [provider]  loratadine (CLARITIN) 10 MG tablet Take 10 mg by mouth daily.    [provider]  montelukast (SINGULAIR) 10 MG tablet Take 10 mg by mouth at bedtime.    [provider]  oxyCODONE (OXY IR/ROXICODONE) 5 MG immediate release tablet Take 1-2 tablets (5-10 mg total) by mouth every 4 (four) hours as needed for moderate pain, severe pain or breakthrough pain. 05/17/16   Greer Pickerel, MD  RaNITidine HCl (ACID REDUCER PO) Take 1 tablet by mouth daily.    [provider]  tamsulosin (FLOMAX) 0.4 MG CAPS capsule Take 0.4 mg by mouth.    [provider]  UNABLE TO FIND Med Name: allergy injections q wk    [provider]  VERAMYST 27.5 MCG/SPRAY nasal spray Place 1 spray into both nostrils every morning. 04/23/16   [provider]    Family History Family History  Problem Relation Age of Onset  . Cancer Father   . Cancer Mother        bladder    Social History Social History   Tobacco Use  . Smoking status: Former Smoker  Packs/day: 1.00    Years: 20.00    Pack years: 20.00  . Smokeless tobacco: Never Used  . Tobacco comment: quit 21 yrs ago  Substance Use Topics  . Alcohol use: Yes    Comment: social use  . Drug use: No     Allergies   Penicillins and Vegetable laxative [psyllium]   Review of Systems Review of Systems  Constitutional: Negative for chills, diaphoresis, fatigue and fever.  HENT: Positive for ear pain and hearing loss. Negative for congestion, ear discharge, rhinorrhea and sinus pressure.   All other systems reviewed and are negative.    Physical Exam Triage Vital Signs ED Triage Vitals  Enc Vitals Group     BP 03/25/19 1750 140/86     Pulse Rate 03/25/19 1750 84     Resp 03/25/19 1750 20     Temp 03/25/19 1750 98  F (36.7 C)     Temp Source 03/25/19 1750 Oral     SpO2 03/25/19 1750 97 %     Weight 03/25/19 1751 220 lb (99.8 kg)     Height 03/25/19 1751 5\' 9"  (1.753 m)     Head Circumference --      Peak Flow --      Pain Score 03/25/19 1751 0     Pain Loc --      Pain Edu? --      Excl. in [place]? --    No data found.  Updated Vital Signs BP 140/86 (BP Location: Right Arm)   Pulse 84   Temp 98 F (36.7 C) (Oral)   Resp 20   Ht 5\' 9"  (1.753 m)   Wt 99.8 kg   SpO2 97%   BMI 32.49 kg/m   Visual Acuity Right Eye Distance:   Left Eye Distance:   Bilateral Distance:    Right Eye Near:   Left Eye Near:    Bilateral Near:     Physical Exam Vitals signs and nursing note reviewed.  Constitutional:      General: He is not in acute distress. HENT:     Head: Normocephalic.     Right Ear: External ear normal. There is impacted cerumen.     Left Ear: External ear normal. There is impacted cerumen.     Ears:     Comments: Post lavage, both canals and tympanic membranes appear normal.    Nose: Nose normal.  Cardiovascular:     Rate and Rhythm: Normal rate.  Pulmonary:     Effort: Pulmonary effort is normal.  Skin:    General: Skin is warm and dry.  Neurological:     Mental Status: He is alert.      UC Treatments / Results  Labs (all labs ordered are listed, but only abnormal results are displayed) Labs Reviewed - No data to display  EKG   Radiology No results found.  Procedures Procedures (including critical care time)  Medications Ordered in UC Medications - No data to display  Initial Impression / Assessment and Plan / UC Course  I have reviewed the triage vital signs and the nursing notes.  Pertinent labs & imaging results that were available during my care of the patient were reviewed by me and considered in my medical decision making (see chart for details).     Call if ear pain develops.  Final Clinical Impressions(s) / UC Diagnoses   Final diagnoses:   Bilateral impacted cerumen     Discharge Instructions  To prevent recurrent ear wax blockage, try the following: Soak two cotton balls with mineral oil, and gently place in each ear canal once weekly.  Leave the cotton balls in place for 10 to 20 minutes.  This will help liquefy the ear wax and aid your body's normal elimination process.  If applicable, do not use a hearing aid for 8 hours overnight.  Have your ears cleaned by a health professional every 6 to 12 months.  Avoid using "Q-tips" and ear wax softening solutions     ED Prescriptions    None        Kandra Nicolas, MD 03/25/19 1825

## 2019-06-23 DIAGNOSIS — L570 Actinic keratosis: Secondary | ICD-10-CM | POA: Diagnosis not present

## 2019-06-23 DIAGNOSIS — L309 Dermatitis, unspecified: Secondary | ICD-10-CM | POA: Diagnosis not present

## 2019-06-23 DIAGNOSIS — L814 Other melanin hyperpigmentation: Secondary | ICD-10-CM | POA: Diagnosis not present

## 2019-06-23 DIAGNOSIS — Z85828 Personal history of other malignant neoplasm of skin: Secondary | ICD-10-CM | POA: Diagnosis not present

## 2019-06-23 DIAGNOSIS — L821 Other seborrheic keratosis: Secondary | ICD-10-CM | POA: Diagnosis not present

## 2019-06-23 DIAGNOSIS — D225 Melanocytic nevi of trunk: Secondary | ICD-10-CM | POA: Diagnosis not present

## 2019-06-23 DIAGNOSIS — L219 Seborrheic dermatitis, unspecified: Secondary | ICD-10-CM | POA: Diagnosis not present

## 2019-06-23 DIAGNOSIS — L578 Other skin changes due to chronic exposure to nonionizing radiation: Secondary | ICD-10-CM | POA: Diagnosis not present

## 2019-06-23 MED FILL — FLUOCINONIDE 0.05 % SOLN: 0.05 | 15 days supply | Qty: 60 | Fill #0

## 2019-06-23 MED FILL — TRIAMCINOLONE 0.1% CREAM: 0.1 | 15 days supply | Qty: 454 | Fill #0

## 2019-06-24 MED FILL — SULFACETAMIDE-SULFUR 10-5%: 10-5 | 30 days supply | Qty: 57 | Fill #2

## 2019-07-28 DIAGNOSIS — L570 Actinic keratosis: Secondary | ICD-10-CM | POA: Diagnosis not present

## 2019-07-28 DIAGNOSIS — L821 Other seborrheic keratosis: Secondary | ICD-10-CM | POA: Diagnosis not present

## 2019-08-13 DIAGNOSIS — Z0000 Encounter for general adult medical examination without abnormal findings: Secondary | ICD-10-CM | POA: Diagnosis not present

## 2019-08-13 DIAGNOSIS — Z131 Encounter for screening for diabetes mellitus: Secondary | ICD-10-CM | POA: Diagnosis not present

## 2019-08-13 DIAGNOSIS — Z13220 Encounter for screening for lipoid disorders: Secondary | ICD-10-CM | POA: Diagnosis not present

## 2019-08-13 DIAGNOSIS — I480 Paroxysmal atrial fibrillation: Secondary | ICD-10-CM | POA: Diagnosis not present

## 2019-08-13 DIAGNOSIS — Z85828 Personal history of other malignant neoplasm of skin: Secondary | ICD-10-CM | POA: Diagnosis not present

## 2019-08-13 DIAGNOSIS — Z1211 Encounter for screening for malignant neoplasm of colon: Secondary | ICD-10-CM | POA: Diagnosis not present

## 2019-10-27 DIAGNOSIS — Z03818 Encounter for observation for suspected exposure to other biological agents ruled out: Secondary | ICD-10-CM | POA: Diagnosis not present

## 2019-10-30 DIAGNOSIS — Z86010 Personal history of colonic polyps: Secondary | ICD-10-CM | POA: Diagnosis not present

## 2019-12-16 DIAGNOSIS — L820 Inflamed seborrheic keratosis: Secondary | ICD-10-CM | POA: Diagnosis not present

## 2019-12-16 DIAGNOSIS — L309 Dermatitis, unspecified: Secondary | ICD-10-CM | POA: Diagnosis not present

## 2020-05-04 ENCOUNTER — Other Ambulatory Visit: Payer: Self-pay

## 2020-05-04 ENCOUNTER — Emergency Department
Admission: EM | Admit: 2020-05-04 | Discharge: 2020-05-04 | Disposition: A | Discharge status: Home / Self Care | Payer: 59 | Source: Home / Self Care

## 2020-05-04 DIAGNOSIS — R059 Cough, unspecified: Secondary | ICD-10-CM

## 2020-05-04 DIAGNOSIS — J069 Acute upper respiratory infection, unspecified: Secondary | ICD-10-CM

## 2020-05-04 MED ORDER — METHYLPREDNISOLONE ACETATE 80 MG/ML IJ SUSP
80.0000 mg | Freq: Once | INTRAMUSCULAR | Status: AC
  Administered 2020-05-04: 80 mg via INTRAMUSCULAR

## 2020-05-04 NOTE — ED Provider Notes (Signed)
[name]    CSN: [id number] Arrival date & time: 05/04/20  1706      History   Chief Complaint Chief Complaint  Patient presents with  . Cough    HPI [name] is a 64 y.o. male.   Complains of dry cough and rhinorrhea.  Patient has seasonal allergies.  Concerned about Covid even though he has been vaccinated and boosted.  HPI  Past Medical History:  Diagnosis Date  . Atrial fibrillation ([place]) 2009   history  . Cancer [facility]) lsdy areas remobved from bnack june 2017   skin basal cell amd squamous cell areas remove from nose back and leg  . Family history of adverse reaction to anesthesia    mother hx ponv  . History of kidney stones 1980's   passed on own  . Left inguinal hernia   . Seasonal allergies   . Sinus infection 03/2016   clear now    There are no problems to display for this patient.   Past Surgical History:  Procedure Laterality Date  . BASAL CELL CARCINOMA EXCISION    . HERNIA REPAIR    . INGUINAL HERNIA REPAIR Bilateral 05/17/2016   Procedure: LAPAROSCOPIC REPAIR OF BILATERAL  INGUINAL HERNIA;  Surgeon: Greer Pickerel, MD;  Location: [facility];  Service: General;  Laterality: Bilateral;  . INSERTION OF MESH Bilateral 05/17/2016   Procedure: INSERTION OF MESH;  Surgeon: Greer Pickerel, MD;  Location: [facility];  Service: General;  Laterality: Bilateral;  . Nasal biopsy         Home Medications    Prior to Admission medications   Medication Sig Start Date End Date Taking? Authorizing Provider  Azelastine HCl 0.15 % SOLN Place 1 spray into both nostrils at bedtime. 04/23/16   [provider]  docusate sodium (COLACE) 100 MG capsule Take 100 mg by mouth as needed for mild constipation.    [provider]  ibuprofen (ADVIL,MOTRIN) 200 MG tablet Take 800 mg by mouth every 4 (four) hours as needed for headache or moderate pain.    [provider]  loratadine (CLARITIN) 10 MG tablet Take 10 mg by mouth daily.     [provider]  montelukast (SINGULAIR) 10 MG tablet Take 10 mg by mouth at bedtime.    [provider]  oxyCODONE (OXY IR/ROXICODONE) 5 MG immediate release tablet Take 1-2 tablets (5-10 mg total) by mouth every 4 (four) hours as needed for moderate pain, severe pain or breakthrough pain. 05/17/16   Greer Pickerel, MD  RaNITidine HCl (ACID REDUCER PO) Take 1 tablet by mouth daily.    [provider]  tamsulosin (FLOMAX) 0.4 MG CAPS capsule Take 0.4 mg by mouth.    [provider]  UNABLE TO FIND Med Name: allergy injections q wk    [provider]  VERAMYST 27.5 MCG/SPRAY nasal spray Place 1 spray into both nostrils every morning. 04/23/16   [provider]    Family History Family History  Problem Relation Age of Onset  . Cancer Father   . Cancer Mother        bladder    Social History Social History   Tobacco Use  . Smoking status: Former Smoker    Packs/day: 1.00    Years: 20.00    Pack years: 20.00  . Smokeless tobacco: Never Used  . Tobacco comment: quit 21 yrs ago  Substance Use Topics  . Alcohol use: Yes    Comment: social use  .  Drug use: No     Allergies   Other and Penicillins   Review of Systems Review of Systems  HENT: Positive for rhinorrhea.   Respiratory: Positive for cough.   All other systems reviewed and are negative.    Physical Exam Triage Vital Signs ED Triage Vitals  Enc Vitals Group     BP 05/04/20 1722 (!) 162/89     Pulse Rate 05/04/20 1722 92     Resp 05/04/20 1722 16     Temp 05/04/20 1722 98.9 F (37.2 C)     Temp Source 05/04/20 1722 Oral     SpO2 05/04/20 1722 97 %     Weight --      Height --      Head Circumference --      Peak Flow --      Pain Score 05/04/20 1719 0     Pain Loc --      Pain Edu? --      Excl. in [place]? --    No data found.  Updated Vital Signs BP (!) 162/89 (BP Location: Left Arm)   Pulse 92   Temp 98.9 F (37.2 C) (Oral)   Resp 16   SpO2 97%    Visual Acuity Right Eye Distance:   Left Eye Distance:   Bilateral Distance:    Right Eye Near:   Left Eye Near:    Bilateral Near:     Physical Exam Vitals and nursing note reviewed.  Constitutional:      Appearance: Normal appearance.  HENT:     Right Ear: Tympanic membrane normal.     Left Ear: Tympanic membrane normal.     Mouth/Throat:     Mouth: Mucous membranes are moist.  Cardiovascular:     Rate and Rhythm: Normal rate and regular rhythm.  Pulmonary:     Effort: Pulmonary effort is normal.     Breath sounds: Normal breath sounds.  Neurological:     General: No focal deficit present.     Mental Status: He is alert and oriented to person, place, and time.      UC Treatments / Results  Labs (all labs ordered are listed, but only abnormal results are displayed) Labs Reviewed - No data to display  EKG   Radiology No results found.  Procedures Procedures (including critical care time)  Medications Ordered in UC Medications  methylPREDNISolone acetate (DEPO-MEDROL) injection 80 mg (80 mg Intramuscular Given 05/04/20 1739)    Initial Impression / Assessment and Plan / UC Course  I have reviewed the triage vital signs and the nursing notes.  Pertinent labs & imaging results that were available during my care of the patient were reviewed by me and considered in my medical decision making (see chart for details).     Allergic bronchitis.  Treat with Depo-Medrol and Mucinex.  Hold antihistamine Final Clinical Impressions(s) / UC Diagnoses   Final diagnoses:  Acute upper respiratory infection   Discharge Instructions   None    ED Prescriptions    None     PDMP not reviewed this encounter.   Wardell Honour, MD 05/04/20 [phone]

## 2020-05-04 NOTE — ED Triage Notes (Signed)
Patient presents to Urgent Care with complaints of dry cough and runny nose since a week ago. Patient reports he has seasonal allergies and thinks that he is having a flare up. Took 2 shots of bourbon to help him sleep last night. Pt would like to be covid tested today, has been vaccinated for covid including booster.

## 2020-05-06 LAB — COVID-19, FLU A+B AND RSV
Influenza A, NAA: NOT DETECTED
Influenza B, NAA: NOT DETECTED
RSV, NAA: NOT DETECTED
SARS-CoV-2, NAA: NOT DETECTED

## 2020-06-19 DIAGNOSIS — Z23 Encounter for immunization: Secondary | ICD-10-CM | POA: Diagnosis not present

## 2020-09-01 DIAGNOSIS — Z0000 Encounter for general adult medical examination without abnormal findings: Secondary | ICD-10-CM | POA: Diagnosis not present

## 2020-09-01 DIAGNOSIS — Z13220 Encounter for screening for lipoid disorders: Secondary | ICD-10-CM | POA: Diagnosis not present

## 2020-11-15 DIAGNOSIS — L821 Other seborrheic keratosis: Secondary | ICD-10-CM | POA: Diagnosis not present

## 2020-11-15 DIAGNOSIS — L578 Other skin changes due to chronic exposure to nonionizing radiation: Secondary | ICD-10-CM | POA: Diagnosis not present

## 2020-11-15 DIAGNOSIS — L814 Other melanin hyperpigmentation: Secondary | ICD-10-CM | POA: Diagnosis not present

## 2020-11-15 DIAGNOSIS — L570 Actinic keratosis: Secondary | ICD-10-CM | POA: Diagnosis not present

## 2020-11-15 DIAGNOSIS — Z85828 Personal history of other malignant neoplasm of skin: Secondary | ICD-10-CM | POA: Diagnosis not present

## 2020-11-15 DIAGNOSIS — D225 Melanocytic nevi of trunk: Secondary | ICD-10-CM | POA: Diagnosis not present

## 2020-11-15 DIAGNOSIS — L820 Inflamed seborrheic keratosis: Secondary | ICD-10-CM | POA: Diagnosis not present

## 2020-11-15 DIAGNOSIS — L739 Follicular disorder, unspecified: Secondary | ICD-10-CM | POA: Diagnosis not present

## 2020-12-04 ENCOUNTER — Telehealth: Payer: 59 | Admitting: Family

## 2020-12-04 DIAGNOSIS — U071 COVID-19: Secondary | ICD-10-CM

## 2020-12-04 MED ORDER — BENZONATATE 100 MG PO CAPS: 100.0000 mg | ORAL_CAPSULE | Freq: Three times a day (TID) | ORAL | 0 refills | Status: DC | PRN

## 2020-12-04 MED ORDER — ALBUTEROL SULFATE HFA 108 (90 BASE) MCG/ACT IN AERS: 2.0000 | INHALATION_SPRAY | Freq: Four times a day (QID) | RESPIRATORY_TRACT | 0 refills | Status: DC | PRN

## 2020-12-04 MED ORDER — MOLNUPIRAVIR EUA 200MG CAPSULE: 4.0000 | ORAL_CAPSULE | Freq: Two times a day (BID) | ORAL | 0 refills | Status: AC

## 2020-12-04 NOTE — Progress Notes (Signed)
Virtual Visit Consent   [name], you are scheduled for a virtual visit with a [facility] provider today.     Just as with appointments in the office, your consent must be obtained to participate.  Your consent will be active for this visit and any virtual visit you may have with one of our providers in the next 365 days.     If you have a MyChart account, a copy of this consent can be sent to you electronically.  All virtual visits are billed to your insurance company just like a traditional visit in the office.    As this is a virtual visit, video technology does not allow for your provider to perform a traditional examination.  This may limit your provider's ability to fully assess your condition.  If your provider identifies any concerns that need to be evaluated in person or the need to arrange testing (such as labs, EKG, etc.), we will make arrangements to do so.     Although advances in technology are sophisticated, we cannot ensure that it will always work on either your end or our end.  If the connection with a video visit is poor, the visit may have to be switched to a telephone visit.  With either a video or telephone visit, we are not always able to ensure that we have a secure connection.     I need to obtain your verbal consent now.   Are you willing to proceed with your visit today?    [name] has provided verbal consent on 12/04/2020 for a virtual visit (video or telephone).   Jose Dun, FNP   Date: 12/04/2020 11:57 AM   Virtual Visit via Video Note   I, [name], connected with  [name]  ([id number], Jun 23, 1955) on 12/04/20 at 11:45 AM EDT by a video-enabled telemedicine application and verified that I am speaking with the correct person using two identifiers.  Location: Patient: Virtual Visit Location Patient: Home Provider: Virtual Visit Location Provider: Home   I discussed the limitations of evaluation and management by telemedicine  and the availability of in person appointments. The patient expressed understanding and agreed to proceed.    History of Present Illness: [name] is a 65 y.o. who identifies as a male who was assigned male at birth, and is being seen today for COVID. He tested positive for COVID 12/02/20 and his symptoms started on 12/02/20.  HPI: Cough This is a new problem. The current episode started in the past 7 days. The problem has been waxing and waning. The problem occurs every few minutes. The cough is Non-productive. Associated symptoms include headaches, myalgias, nasal congestion, postnasal drip, a sore throat, shortness of breath and wheezing. Pertinent negatives include no chills, ear congestion, ear pain or fever. The symptoms are aggravated by lying down. He has tried rest (tylenol) for the symptoms. The treatment provided no relief.   Problems: There are no problems to display for this patient.   Allergies:  Allergies  Allergen Reactions   Other Anaphylaxis    Squash   Penicillins Anaphylaxis and Hives    Has patient had a PCN reaction causing immediate rash, facial/tongue/throat swelling, SOB or lightheadedness with hypotension: Yes Has patient had a PCN reaction causing severe rash involving mucus membranes or skin necrosis: Yes Has patient had a PCN reaction that required hospitalization No Has patient had a PCN reaction occurring within the last 10 years: No If all of the  above answers are "NO", then may proceed with Cephalosporin use.    Medications:  Current Outpatient Medications:    albuterol (VENTOLIN HFA) 108 (90 Base) MCG/ACT inhaler, Inhale 2 puffs into the lungs every 6 (six) hours as needed for wheezing or shortness of breath., Disp: 8 g, Rfl: 0   benzonatate (TESSALON PERLES) 100 MG capsule, Take 1 capsule (100 mg total) by mouth 3 (three) times daily as needed., Disp: 20 capsule, Rfl: 0   molnupiravir EUA 200 mg CAPS, Take 4 capsules (800 mg total) by mouth 2 (two)  times daily for 5 days., Disp: 40 capsule, Rfl: 0   Azelastine HCl 0.15 % SOLN, Place 1 spray into both nostrils at bedtime., Disp: , Rfl: 1   docusate sodium (COLACE) 100 MG capsule, Take 100 mg by mouth as needed for mild constipation., Disp: , Rfl:    ibuprofen (ADVIL,MOTRIN) 200 MG tablet, Take 800 mg by mouth every 4 (four) hours as needed for headache or moderate pain., Disp: , Rfl:    loratadine (CLARITIN) 10 MG tablet, Take 10 mg by mouth daily., Disp: , Rfl:    montelukast (SINGULAIR) 10 MG tablet, Take 10 mg by mouth at bedtime., Disp: , Rfl:    oxyCODONE (OXY IR/ROXICODONE) 5 MG immediate release tablet, Take 1-2 tablets (5-10 mg total) by mouth every 4 (four) hours as needed for moderate pain, severe pain or breakthrough pain., Disp: 40 tablet, Rfl: 0   RaNITidine HCl (ACID REDUCER PO), Take 1 tablet by mouth daily., Disp: , Rfl:    tamsulosin (FLOMAX) 0.4 MG CAPS capsule, Take 0.4 mg by mouth., Disp: , Rfl:    UNABLE TO FIND, Med Name: allergy injections q wk, Disp: , Rfl:    VERAMYST 27.5 MCG/SPRAY nasal spray, Place 1 spray into both nostrils every morning., Disp: , Rfl: 1  Observations/Objective: Patient is well-developed, well-nourished in no acute distress.  Resting comfortably  at home.  Head is normocephalic, atraumatic.  No labored breathing. Speech is clear and coherent with logical content.  Patient is alert and oriented at baseline.  Hoarse voice  Assessment and Plan: 1. COVID-19 virus detected - molnupiravir EUA 200 mg CAPS; Take 4 capsules (800 mg total) by mouth 2 (two) times daily for 5 days.  Dispense: 40 capsule; Refill: 0 - albuterol (VENTOLIN HFA) 108 (90 Base) MCG/ACT inhaler; Inhale 2 puffs into the lungs every 6 (six) hours as needed for wheezing or shortness of breath.  Dispense: 8 g; Refill: 0 - benzonatate (TESSALON PERLES) 100 MG capsule; Take 1 capsule (100 mg total) by mouth 3 (three) times daily as needed.  Dispense: 20 capsule; Refill: 0 COVID  positive, rest, force fluids, tylenol as needed, Quarantine for at least 5 days and fever free, report any worsening symptoms such as increased shortness of breath, swelling, or continued high fevers.  Possible adverse effects discussed   Follow Up Instructions: I discussed the assessment and treatment plan with the patient. The patient was provided an opportunity to ask questions and all were answered. The patient agreed with the plan and demonstrated an understanding of the instructions.  A copy of instructions were sent to the patient via MyChart.  The patient was advised to call back or seek an in-person evaluation if the symptoms worsen or if the condition fails to improve as anticipated.  Time:  I spent 7 minutes with the patient via telehealth technology discussing the above problems/concerns.    Jose Dun, FNP

## 2021-05-19 DIAGNOSIS — L570 Actinic keratosis: Secondary | ICD-10-CM | POA: Diagnosis not present

## 2021-09-11 DIAGNOSIS — Z23 Encounter for immunization: Secondary | ICD-10-CM | POA: Diagnosis not present

## 2021-09-11 DIAGNOSIS — Z0000 Encounter for general adult medical examination without abnormal findings: Secondary | ICD-10-CM | POA: Diagnosis not present

## 2021-09-11 DIAGNOSIS — Z13220 Encounter for screening for lipoid disorders: Secondary | ICD-10-CM | POA: Diagnosis not present

## 2021-10-02 ENCOUNTER — Other Ambulatory Visit (HOSPITAL_BASED_OUTPATIENT_CLINIC_OR_DEPARTMENT_OTHER): Payer: Self-pay

## 2021-10-02 ENCOUNTER — Emergency Department
Admission: EM | Admit: 2021-10-02 | Discharge: 2021-10-02 | Disposition: A | Discharge status: Home / Self Care | Payer: 59 | Source: Home / Self Care | Attending: Family Medicine | Admitting: Family Medicine

## 2021-10-02 DIAGNOSIS — L578 Other skin changes due to chronic exposure to nonionizing radiation: Secondary | ICD-10-CM | POA: Diagnosis not present

## 2021-10-02 DIAGNOSIS — L409 Psoriasis, unspecified: Secondary | ICD-10-CM | POA: Diagnosis not present

## 2021-10-02 DIAGNOSIS — D225 Melanocytic nevi of trunk: Secondary | ICD-10-CM | POA: Diagnosis not present

## 2021-10-02 DIAGNOSIS — R0609 Other forms of dyspnea: Secondary | ICD-10-CM

## 2021-10-02 DIAGNOSIS — D485 Neoplasm of uncertain behavior of skin: Secondary | ICD-10-CM | POA: Diagnosis not present

## 2021-10-02 DIAGNOSIS — L814 Other melanin hyperpigmentation: Secondary | ICD-10-CM | POA: Diagnosis not present

## 2021-10-02 DIAGNOSIS — L821 Other seborrheic keratosis: Secondary | ICD-10-CM | POA: Diagnosis not present

## 2021-10-02 DIAGNOSIS — H00025 Hordeolum internum left lower eyelid: Secondary | ICD-10-CM | POA: Diagnosis not present

## 2021-10-02 DIAGNOSIS — R1312 Dysphagia, oropharyngeal phase: Secondary | ICD-10-CM

## 2021-10-02 DIAGNOSIS — L739 Follicular disorder, unspecified: Secondary | ICD-10-CM | POA: Diagnosis not present

## 2021-10-02 DIAGNOSIS — Z85828 Personal history of other malignant neoplasm of skin: Secondary | ICD-10-CM | POA: Diagnosis not present

## 2021-10-02 DIAGNOSIS — L089 Local infection of the skin and subcutaneous tissue, unspecified: Secondary | ICD-10-CM | POA: Diagnosis not present

## 2021-10-02 MED ORDER — DOXYCYCLINE HYCLATE 100 MG PO CAPS
100.0000 mg | ORAL_CAPSULE | Freq: Two times a day (BID) | ORAL | 0 refills | Status: DC
  Filled 2021-10-02: qty 14, 7d supply, fill #0

## 2021-10-02 MED ORDER — TOBRAMYCIN 0.3 % OP SOLN
1.0000 [drp] | OPHTHALMIC | 0 refills | Status: DC
  Filled 2021-10-02: qty 5, 17d supply, fill #0

## 2021-10-02 MED ORDER — CLINDAMYCIN PHOSPHATE 1 % EX SOLN
CUTANEOUS | 2 refills | Status: DC
  Filled 2021-10-02: qty 60, 30d supply, fill #0

## 2021-10-02 MED ORDER — TRIAMCINOLONE ACETONIDE 0.1 % EX CREA
TOPICAL_CREAM | CUTANEOUS | 1 refills | Status: AC
  Filled 2021-10-02: qty 60, 14d supply, fill #0

## 2021-10-02 NOTE — ED Triage Notes (Addendum)
Pt c/o LT lower lid swelling and redness x 1 week. Tender to touch. Warm compresses prn. Also c/o persistent cough since having covid last June 22'. Hx of GERD but takes meds for it. No burning sensation. Says sometimes he does cough up mucous.

## 2021-10-02 NOTE — ED Provider Notes (Signed)
[name] [facility]    CSN: [id number] Arrival date & time: 10/02/21  1452      History   Chief Complaint Chief Complaint  Patient presents with   Eye Problem    LT   Cough    HPI [name] is a 66 y.o. male.   HPI  Pleasant 66 year old gentleman.  Employee of [employer].  He is here with 2 problems.  His first problems is that he has redness and swelling of his left eye.  Is progressively worsening over the last couple of weeks.  He now has swelling underneath his eye on the eyelid.  It is painful to touch but otherwise not uncomfortable.  The vision is normal.  One of the problem he complains about is coughing and mucus when he tries to eat.  Sometimes when he tries to swallow and eat he coughs and gags and spits up a cup or more of mucus before he can finish eating a meal.  He does not wear wrist comes from.  He does have allergies and it could be from his sinuses.  He thinks it may be from his throat or chest.  He does have GERD and takes medicine for this.  I discussed this is a problem for his PCP and that he may need a swallowing study  Patient states he has gained weight after COVID.  He had a COVID infection has been suffering from poor exercise tolerance, shortness of breath, and weight gain ever since his infection a year ago.  He states that he would like to start walking again to lose weight and improve his physical condition but he cannot do this until he gets his breathing under control.  I discussed with him that he should see his PCP for this problem and consider pulmonary consultation  Past Medical History:  Diagnosis Date   Atrial fibrillation ([place]) 2009   history   Cancer ([place]) lsdy areas remobved from bnack june 2017   skin basal cell amd squamous cell areas remove from nose back and leg   Family history of adverse reaction to anesthesia    mother hx ponv   History of kidney stones 1980's   passed on own   Left inguinal hernia    Seasonal  allergies    Sinus infection 03/2016   clear now    There are no problems to display for this patient.   Past Surgical History:  Procedure Laterality Date   BASAL CELL CARCINOMA EXCISION     HERNIA REPAIR     INGUINAL HERNIA REPAIR Bilateral 05/17/2016   Procedure: LAPAROSCOPIC REPAIR OF BILATERAL  INGUINAL HERNIA;  Surgeon: Greer Pickerel, MD;  Location: [facility];  Service: General;  Laterality: Bilateral;   INSERTION OF MESH Bilateral 05/17/2016   Procedure: INSERTION OF MESH;  Surgeon: Greer Pickerel, MD;  Location: [facility];  Service: General;  Laterality: Bilateral;   Nasal biopsy         Home Medications    Prior to Admission medications   Medication Sig Start Date End Date Taking? Authorizing Provider  doxycycline (VIBRAMYCIN) 100 MG capsule Take 1 capsule (100 mg total) by mouth 2 (two) times daily. 10/02/21  Yes Raylene Everts, MD  tobramycin (TOBREX) 0.3 % ophthalmic solution Place 1 drop into the left eye every 4 (four) hours. 10/02/21  Yes Raylene Everts, MD  albuterol (VENTOLIN HFA) 108 (90 Base) MCG/ACT inhaler Inhale 2 puffs into the lungs every 6 (six) hours as  needed for wheezing or shortness of breath. 12/04/20   [name] A, FNP  Azelastine HCl 0.15 % SOLN Place 1 spray into both nostrils at bedtime. 04/23/16   [provider]  benzonatate (TESSALON PERLES) 100 MG capsule Take 1 capsule (100 mg total) by mouth 3 (three) times daily as needed. 12/04/20   Sharion Balloon, FNP  clindamycin (CLEOCIN T) 1 % external solution Apply 1 application on to the skin 2 times daily 10/02/21     docusate sodium (COLACE) 100 MG capsule Take 100 mg by mouth as needed for mild constipation.    [provider]  ibuprofen (ADVIL,MOTRIN) 200 MG tablet Take 800 mg by mouth every 4 (four) hours as needed for headache or moderate pain.    [provider]  loratadine (CLARITIN) 10 MG tablet Take 10 mg by mouth daily.    [provider]  montelukast  (SINGULAIR) 10 MG tablet Take 10 mg by mouth at bedtime.    [provider]  RaNITidine HCl (ACID REDUCER PO) Take 1 tablet by mouth daily.    [provider]  tamsulosin (FLOMAX) 0.4 MG CAPS capsule Take 0.4 mg by mouth.    [provider]  triamcinolone cream (KENALOG) 0.1 % Apply 1 application on to the skin 2 times daily for 14 days 10/02/21     UNABLE TO FIND Med Name: allergy injections q wk    [provider]  VERAMYST 27.5 MCG/SPRAY nasal spray Place 1 spray into both nostrils every morning. 04/23/16   [provider]    Family History Family History  Problem Relation Age of Onset   Cancer Father    Cancer Mother        bladder    Social History Social History   Tobacco Use   Smoking status: Former    Packs/day: 1.00    Years: 20.00    Pack years: 20.00    Types: Cigarettes   Smokeless tobacco: Never   Tobacco comments:    quit 21 yrs ago  Substance Use Topics   Alcohol use: Yes    Comment: social use   Drug use: No     Allergies   Other and Penicillins   Review of Systems Review of Systems See HPI  Physical Exam Triage Vital Signs ED Triage Vitals  Enc Vitals Group     BP 10/02/21 1505 (!) 147/89     Pulse Rate 10/02/21 1505 (!) 105     Resp 10/02/21 1505 18     Temp 10/02/21 1505 98.1 F (36.7 C)     Temp Source 10/02/21 1505 Oral     SpO2 10/02/21 1505 95 %     Weight --      Height --      Head Circumference --      Peak Flow --      Pain Score 10/02/21 1506 0     Pain Loc --      Pain Edu? --      Excl. in [place]? --    No data found.  Updated Vital Signs BP (!) 147/89 (BP Location: Right Arm)   Pulse (!) 105   Temp 98.1 F (36.7 C) (Oral)   Resp 18   SpO2 95%     Physical Exam Constitutional:      General: He is not in acute distress.    Appearance: Normal appearance. He is well-developed.  HENT:     Head: Normocephalic and atraumatic.  Right Ear: Tympanic membrane and ear canal  normal.     Left Ear: Tympanic membrane and ear canal normal.     Nose: Nose normal. No congestion.     Mouth/Throat:     Mouth: Mucous membranes are moist.     Pharynx: No posterior oropharyngeal erythema.  Eyes:     Pupils: Pupils are equal, round, and reactive to light.      Comments: Internal hordeolum of left lower lid.  Seen on eversion of lid.  There are some erythema of the surrounding skin  Cardiovascular:     Rate and Rhythm: Normal rate and regular rhythm.     Heart sounds: Normal heart sounds.  Pulmonary:     Effort: Pulmonary effort is normal. No respiratory distress.     Breath sounds: Normal breath sounds.  Abdominal:     General: There is no distension.     Palpations: Abdomen is soft.  Musculoskeletal:        General: Normal range of motion.     Cervical back: Normal range of motion.     Right lower leg: No edema.     Left lower leg: No edema.  Lymphadenopathy:     Cervical: No cervical adenopathy.  Skin:    General: Skin is warm and dry.  Neurological:     Mental Status: He is alert.     UC Treatments / Results  Labs (all labs ordered are listed, but only abnormal results are displayed) Labs Reviewed - No data to display  EKG   Radiology No results found.  Procedures Procedures (including critical care time)  Medications Ordered in UC Medications - No data to display  Initial Impression / Assessment and Plan / UC Course  I have reviewed the triage vital signs and the nursing notes.  Pertinent labs & imaging results that were available during my care of the patient were reviewed by me and considered in my medical decision making (see chart for details).    I explained to the patient that I can treat his hordeolum.  See eye doctor for sore improved.  He needs to contact his PCP regarding his other longstanding complaints. Final Clinical Impressions(s) / UC Diagnoses   Final diagnoses:  Hordeolum internum of left lower eyelid      Discharge Instructions      Use the eyedrops as directed Take antibiotic 2 times a week.  It is important to take this antibiotic with food Use warm compresses to the area  Have your doctor place a referral to [facility] for evaluation of shortness of breath  Elevate head of bed   ED Prescriptions     Medication Sig Dispense Auth. Provider   doxycycline (VIBRAMYCIN) 100 MG capsule Take 1 capsule (100 mg total) by mouth 2 (two) times daily. 14 capsule Raylene Everts, MD   tobramycin (TOBREX) 0.3 % ophthalmic solution Place 1 drop into the left eye every 4 (four) hours. 5 mL Raylene Everts, MD      PDMP not reviewed this encounter.   Raylene Everts, MD 10/02/21 [phone]

## 2021-10-02 NOTE — Discharge Instructions (Addendum)
Use the eyedrops as directed Take antibiotic 2 times a week.  It is important to take this antibiotic with food Use warm compresses to the area  Have your doctor place a referral to [facility] for evaluation of shortness of breath  Elevate head of bed

## 2021-10-03 ENCOUNTER — Other Ambulatory Visit (HOSPITAL_BASED_OUTPATIENT_CLINIC_OR_DEPARTMENT_OTHER): Payer: Self-pay

## 2021-11-29 DIAGNOSIS — H524 Presbyopia: Secondary | ICD-10-CM | POA: Diagnosis not present

## 2021-12-26 ENCOUNTER — Ambulatory Visit (HOSPITAL_BASED_OUTPATIENT_CLINIC_OR_DEPARTMENT_OTHER)
Admission: RE | Admit: 2021-12-26 | Discharge: 2021-12-26 | Disposition: A | Discharge status: Home / Self Care | Payer: 59 | Source: Ambulatory Visit | Attending: Family Medicine | Admitting: Family Medicine

## 2021-12-26 ENCOUNTER — Other Ambulatory Visit (HOSPITAL_BASED_OUTPATIENT_CLINIC_OR_DEPARTMENT_OTHER): Payer: Self-pay

## 2021-12-26 ENCOUNTER — Other Ambulatory Visit (HOSPITAL_BASED_OUTPATIENT_CLINIC_OR_DEPARTMENT_OTHER): Payer: Self-pay | Admitting: Family Medicine

## 2021-12-26 DIAGNOSIS — R059 Cough, unspecified: Secondary | ICD-10-CM | POA: Insufficient documentation

## 2021-12-26 MED ORDER — FLUTICASONE-SALMETEROL 113-14 MCG/ACT IN AEPB
INHALATION_SPRAY | RESPIRATORY_TRACT | 0 refills | Status: AC
  Filled 2021-12-26: qty 1, 30d supply, fill #0

## 2021-12-27 ENCOUNTER — Other Ambulatory Visit (HOSPITAL_BASED_OUTPATIENT_CLINIC_OR_DEPARTMENT_OTHER): Payer: Self-pay

## 2021-12-28 ENCOUNTER — Other Ambulatory Visit (HOSPITAL_BASED_OUTPATIENT_CLINIC_OR_DEPARTMENT_OTHER): Payer: Self-pay

## 2021-12-28 MED ORDER — FLUTICASONE-SALMETEROL 115-21 MCG/ACT IN AERO
INHALATION_SPRAY | RESPIRATORY_TRACT | 5 refills | Status: DC
  Filled 2021-12-28: qty 12, 30d supply, fill #0
  Filled 2022-01-23: qty 12, 30d supply, fill #1
  Filled 2022-02-22: qty 12, 30d supply, fill #2
  Filled 2022-04-02: qty 12, 30d supply, fill #3
  Filled 2022-04-30: qty 12, 30d supply, fill #4
  Filled 2022-05-28: qty 12, 30d supply, fill #5

## 2021-12-29 ENCOUNTER — Other Ambulatory Visit (HOSPITAL_BASED_OUTPATIENT_CLINIC_OR_DEPARTMENT_OTHER): Payer: Self-pay

## 2022-01-23 ENCOUNTER — Other Ambulatory Visit (HOSPITAL_BASED_OUTPATIENT_CLINIC_OR_DEPARTMENT_OTHER): Payer: Self-pay

## 2022-02-22 ENCOUNTER — Other Ambulatory Visit (HOSPITAL_BASED_OUTPATIENT_CLINIC_OR_DEPARTMENT_OTHER): Payer: Self-pay

## 2022-04-02 ENCOUNTER — Other Ambulatory Visit (HOSPITAL_BASED_OUTPATIENT_CLINIC_OR_DEPARTMENT_OTHER): Payer: Self-pay

## 2022-04-30 ENCOUNTER — Other Ambulatory Visit: Payer: Self-pay

## 2022-05-28 ENCOUNTER — Other Ambulatory Visit (HOSPITAL_BASED_OUTPATIENT_CLINIC_OR_DEPARTMENT_OTHER): Payer: Self-pay

## 2022-07-02 ENCOUNTER — Other Ambulatory Visit (HOSPITAL_BASED_OUTPATIENT_CLINIC_OR_DEPARTMENT_OTHER): Payer: Self-pay

## 2022-07-02 MED ORDER — FLUTICASONE-SALMETEROL 115-21 MCG/ACT IN AERO
2.0000 | INHALATION_SPRAY | Freq: Two times a day (BID) | RESPIRATORY_TRACT | 3 refills | Status: DC
  Filled 2022-07-02: qty 12, 30d supply, fill #0
  Filled 2022-08-01: qty 12, 30d supply, fill #1
  Filled 2022-08-27: qty 12, 30d supply, fill #2
  Filled 2022-09-25: qty 12, 30d supply, fill #3

## 2022-08-01 ENCOUNTER — Other Ambulatory Visit (HOSPITAL_BASED_OUTPATIENT_CLINIC_OR_DEPARTMENT_OTHER): Payer: Self-pay

## 2022-08-26 ENCOUNTER — Encounter: Payer: Self-pay | Admitting: Emergency Medicine

## 2022-08-26 ENCOUNTER — Ambulatory Visit
Admission: EM | Admit: 2022-08-26 | Discharge: 2022-08-26 | Disposition: A | Discharge status: Home / Self Care | Payer: Commercial Managed Care - PPO | Attending: Family Medicine | Admitting: Family Medicine

## 2022-08-26 DIAGNOSIS — H6123 Impacted cerumen, bilateral: Secondary | ICD-10-CM | POA: Diagnosis not present

## 2022-08-26 MED ORDER — NEOMYCIN-POLYMYXIN-HC 3.5-10000-1 OT SUSP
3.0000 [drp] | Freq: Three times a day (TID) | OTIC | 0 refills | Status: AC
  Filled 2022-08-26: qty 10, 22d supply, fill #0

## 2022-08-26 NOTE — ED Provider Notes (Signed)
[name]    CSN: [id number] Arrival date & time: 08/26/22  0912      History   Chief Complaint Chief Complaint  Patient presents with   Cerumen Impaction    HPI [name] is a 67 y.o. male.   HPI  Patient states she has had some asthma and allergies allergies with runny stuffy nose and itchy eyes.  He controls these with over-the-counter medicines.  For the last 2 weeks he has had some fullness in his ears.  Feels like he has slight decreased hearing.  There is no pain.  He cannot clear pop his ears.  He would like to have them checked  Past Medical History:  Diagnosis Date   Atrial fibrillation 2009   history   Cancer lsdy areas remobved from bnack june 2017   skin basal cell amd squamous cell areas remove from nose back and leg   Family history of adverse reaction to anesthesia    mother hx ponv   History of kidney stones 1980's   passed on own   Left inguinal hernia    Seasonal allergies    Sinus infection 03/2016   clear now    There are no problems to display for this patient.   Past Surgical History:  Procedure Laterality Date   BASAL CELL CARCINOMA EXCISION     HERNIA REPAIR     INGUINAL HERNIA REPAIR Bilateral 05/17/2016   Procedure: LAPAROSCOPIC REPAIR OF BILATERAL  INGUINAL HERNIA;  Surgeon: Gaynelle Adu, MD;  Location: [facility];  Service: General;  Laterality: Bilateral;   INSERTION OF MESH Bilateral 05/17/2016   Procedure: INSERTION OF MESH;  Surgeon: Gaynelle Adu, MD;  Location: [facility];  Service: General;  Laterality: Bilateral;   Nasal biopsy         Home Medications    Prior to Admission medications   Medication Sig Start Date End Date Taking? Authorizing Provider  Azelastine HCl 0.15 % SOLN Place 1 spray into both nostrils at bedtime. 04/23/16  Yes [provider]  fluticasone-salmeterol (ADVAIR HFA) 115-21 MCG/ACT inhaler Inhale 2 puffs into the lungs 2 (two) times daily. 07/02/22  Yes   ibuprofen (ADVIL,MOTRIN) 200  MG tablet Take 800 mg by mouth every 4 (four) hours as needed for headache or moderate pain.   Yes [provider]  loratadine (CLARITIN) 10 MG tablet Take 10 mg by mouth daily.   Yes [provider]  montelukast (SINGULAIR) 10 MG tablet Take 10 mg by mouth at bedtime.   Yes [provider]  neomycin-polymyxin-hydrocortisone (CORTISPORIN) 3.5-10000-1 OTIC suspension Place 3 drops into both ears 3 (three) times daily. 08/26/22  Yes Eustace Moore, MD  RaNITidine HCl (ACID REDUCER PO) Take 1 tablet by mouth daily.   Yes [provider]  tamsulosin (FLOMAX) 0.4 MG CAPS capsule Take 0.4 mg by mouth.   Yes [provider]  VERAMYST 27.5 MCG/SPRAY nasal spray Place 1 spray into both nostrils every morning. 04/23/16  Yes [provider]  Fluticasone-Salmeterol 113-14 MCG/ACT AEPB Inhale 1 puff by mouth Twice a day 12/26/21     triamcinolone cream (KENALOG) 0.1 % Apply 1 application on to the skin 2 times daily for 14 days 10/02/21     UNABLE TO FIND Med Name: allergy injections q wk    [provider]    Family History Family History  Problem Relation Age of Onset   Cancer Father    Cancer Mother  bladder    Social History Social History   Tobacco Use   Smoking status: Former    Packs/day: 1.00    Years: 20.00    Additional pack years: 0.00    Total pack years: 20.00    Types: Cigarettes   Smokeless tobacco: Never   Tobacco comments:    quit 21 yrs ago  Vaping Use   Vaping Use: Never used  Substance Use Topics   Alcohol use: Yes    Comment: social use   Drug use: No     Allergies   Other and Penicillins   Review of Systems Review of Systems See HPI  Physical Exam Triage Vital Signs ED Triage Vitals  Enc Vitals Group     BP 08/26/22 0923 (!) 145/84     Pulse Rate 08/26/22 0923 94     Resp 08/26/22 0923 16     Temp 08/26/22 0923 97.8 F (36.6 C)     Temp Source 08/26/22 0923 Oral     SpO2 08/26/22  0923 94 %     Weight 08/26/22 0925 220 lb (99.8 kg)     Height 08/26/22 0925  (1.753 m)     Head Circumference --      Peak Flow --      Pain Score 08/26/22 0924 0     Pain Loc --      Pain Edu? --      Excl. in [place]? --    No data found.  Updated Vital Signs BP (!) 145/84 (BP Location: Right Arm)   Pulse 94   Temp 97.8 F (36.6 C) (Oral)   Resp 16   Ht  (1.753 m)   Wt 99.8 kg   SpO2 94%   BMI 32.49 kg/m      Physical Exam Constitutional:      General: He is not in acute distress.    Appearance: He is well-developed.  HENT:     Head: Normocephalic and atraumatic.     Right Ear: There is impacted cerumen.     Left Ear: There is impacted cerumen.  Eyes:     Conjunctiva/sclera: Conjunctivae normal.     Pupils: Pupils are equal, round, and reactive to light.  Cardiovascular:     Rate and Rhythm: Normal rate.  Pulmonary:     Effort: Pulmonary effort is normal. No respiratory distress.  Abdominal:     General: There is no distension.     Palpations: Abdomen is soft.  Musculoskeletal:        General: Normal range of motion.     Cervical back: Normal range of motion.  Skin:    General: Skin is warm and dry.  Neurological:     Mental Status: He is alert.   After lavage and curettage the left canal and TM are clear.  The right canal has slight erythema.  Patient complains of discomfort with traction of right pinna.  Will provide eardrops   UC Treatments / Results  Labs (all labs ordered are listed, but only abnormal results are displayed) Labs Reviewed - No data to display  EKG   Radiology No results found.  Procedures Procedures (including critical care time)  Medications Ordered in UC Medications - No data to display  Initial Impression / Assessment and Plan / UC Course  I have reviewed the triage vital signs and the nursing notes.  Pertinent labs & imaging results that were available during my care of the patient were reviewed by  me and  considered in my medical decision making (see chart for details).     Final Clinical Impressions(s) / UC Diagnoses   Final diagnoses:  Bilateral impacted cerumen     Discharge Instructions      Eardrops for a few days to take down residual swelling and irritation Return as needed     ED Prescriptions     Medication Sig Dispense Auth. Provider   neomycin-polymyxin-hydrocortisone (CORTISPORIN) 3.5-10000-1 OTIC suspension Place 3 drops into both ears 3 (three) times daily. 10 mL Eustace Moore, MD      PDMP not reviewed this encounter.   Eustace Moore, MD 08/26/22 1040

## 2022-08-26 NOTE — Discharge Instructions (Signed)
Eardrops for a few days to take down residual swelling and irritation Return as needed

## 2022-08-26 NOTE — ED Triage Notes (Signed)
Patient c/o bilateral ear fullness x 2 weeks.  Patient does have a history of allergies.  Denies any other sx's.

## 2022-08-27 ENCOUNTER — Other Ambulatory Visit (HOSPITAL_BASED_OUTPATIENT_CLINIC_OR_DEPARTMENT_OTHER): Payer: Self-pay

## 2022-09-13 DIAGNOSIS — Z0000 Encounter for general adult medical examination without abnormal findings: Secondary | ICD-10-CM | POA: Diagnosis not present

## 2022-09-13 DIAGNOSIS — Z23 Encounter for immunization: Secondary | ICD-10-CM | POA: Diagnosis not present

## 2022-09-25 ENCOUNTER — Other Ambulatory Visit (HOSPITAL_BASED_OUTPATIENT_CLINIC_OR_DEPARTMENT_OTHER): Payer: Self-pay

## 2022-10-10 ENCOUNTER — Other Ambulatory Visit (HOSPITAL_BASED_OUTPATIENT_CLINIC_OR_DEPARTMENT_OTHER): Payer: Self-pay

## 2022-10-10 DIAGNOSIS — L814 Other melanin hyperpigmentation: Secondary | ICD-10-CM | POA: Diagnosis not present

## 2022-10-10 DIAGNOSIS — C441122 Basal cell carcinoma of skin of right lower eyelid, including canthus: Secondary | ICD-10-CM | POA: Diagnosis not present

## 2022-10-10 DIAGNOSIS — Z85828 Personal history of other malignant neoplasm of skin: Secondary | ICD-10-CM | POA: Diagnosis not present

## 2022-10-10 DIAGNOSIS — L578 Other skin changes due to chronic exposure to nonionizing radiation: Secondary | ICD-10-CM | POA: Diagnosis not present

## 2022-10-10 DIAGNOSIS — D225 Melanocytic nevi of trunk: Secondary | ICD-10-CM | POA: Diagnosis not present

## 2022-10-10 DIAGNOSIS — L821 Other seborrheic keratosis: Secondary | ICD-10-CM | POA: Diagnosis not present

## 2022-10-10 DIAGNOSIS — D485 Neoplasm of uncertain behavior of skin: Secondary | ICD-10-CM | POA: Diagnosis not present

## 2022-10-10 DIAGNOSIS — L820 Inflamed seborrheic keratosis: Secondary | ICD-10-CM | POA: Diagnosis not present

## 2022-10-10 MED ORDER — FLUTICASONE-SALMETEROL 115-21 MCG/ACT IN AERO
2.0000 | INHALATION_SPRAY | Freq: Two times a day (BID) | RESPIRATORY_TRACT | 3 refills | Status: DC
  Filled 2022-10-10 – 2022-10-25 (×2): qty 12, 30d supply, fill #0
  Filled 2022-11-29: qty 12, 30d supply, fill #1
  Filled 2022-12-28: qty 12, 30d supply, fill #2
  Filled 2023-01-25: qty 12, 30d supply, fill #3

## 2022-10-25 ENCOUNTER — Other Ambulatory Visit (HOSPITAL_BASED_OUTPATIENT_CLINIC_OR_DEPARTMENT_OTHER): Payer: Self-pay

## 2022-11-29 ENCOUNTER — Other Ambulatory Visit (HOSPITAL_BASED_OUTPATIENT_CLINIC_OR_DEPARTMENT_OTHER): Payer: Self-pay

## 2022-11-29 DIAGNOSIS — L309 Dermatitis, unspecified: Secondary | ICD-10-CM | POA: Diagnosis not present

## 2022-11-29 DIAGNOSIS — C441122 Basal cell carcinoma of skin of right lower eyelid, including canthus: Secondary | ICD-10-CM | POA: Diagnosis not present

## 2022-11-29 DIAGNOSIS — L821 Other seborrheic keratosis: Secondary | ICD-10-CM | POA: Diagnosis not present

## 2022-11-29 DIAGNOSIS — L918 Other hypertrophic disorders of the skin: Secondary | ICD-10-CM | POA: Diagnosis not present

## 2022-11-29 DIAGNOSIS — L570 Actinic keratosis: Secondary | ICD-10-CM | POA: Diagnosis not present

## 2022-11-29 MED ORDER — TRIAMCINOLONE ACETONIDE 0.1 % EX CREA
TOPICAL_CREAM | CUTANEOUS | 0 refills | Status: AC
  Filled 2022-11-29: qty 30, 14d supply, fill #0

## 2022-12-01 ENCOUNTER — Other Ambulatory Visit (HOSPITAL_BASED_OUTPATIENT_CLINIC_OR_DEPARTMENT_OTHER): Payer: Self-pay

## 2022-12-28 ENCOUNTER — Other Ambulatory Visit (HOSPITAL_BASED_OUTPATIENT_CLINIC_OR_DEPARTMENT_OTHER): Payer: Self-pay

## 2023-01-25 ENCOUNTER — Other Ambulatory Visit (HOSPITAL_BASED_OUTPATIENT_CLINIC_OR_DEPARTMENT_OTHER): Payer: Self-pay

## 2023-01-28 ENCOUNTER — Other Ambulatory Visit: Payer: Self-pay | Admitting: *Deleted

## 2023-01-28 DIAGNOSIS — Z125 Encounter for screening for malignant neoplasm of prostate: Secondary | ICD-10-CM

## 2023-01-29 NOTE — Progress Notes (Signed)
Patient: [place]           Date of Birth: 1956-01-14           MRN: [id number] Visit Date: 01/28/2023 PCP: Marinda Elk, MD  Prostate Cancer Screening Date of last physical exam: 09/12/22 Date of last rectal exam:  (unknown) Have you ever had any of the following?: None Have you ever had or been told you have an allergy to latex products?: No Are you currently taking any natural prostate preparations?: No Are you currently experiencing any urinary symptoms?: No  Prostate Exam Exam not completed. PSA blood test completed only.  Patient's History There are no problems to display for this patient.  Past Medical History:  Diagnosis Date   Atrial fibrillation ([facility]) 2009   history   Cancer ([facility]) lsdy areas remobved from bnack june 2017   skin basal cell amd squamous cell areas remove from nose back and leg   Family history of adverse reaction to anesthesia    mother hx ponv   History of kidney stones 1980's   passed on own   Left inguinal hernia    Seasonal allergies    Sinus infection 03/2016   clear now    Family History  Problem Relation Age of Onset   Cancer Father    Cancer Mother        bladder    Social History   Occupational History   Not on file  Tobacco Use   Smoking status: Former    Current packs/day: 1.00    Average packs/day: 1 pack/day for 20.0 years (20.0 ttl pk-yrs)    Types: Cigarettes   Smokeless tobacco: Never   Tobacco comments:    quit 21 yrs ago  Vaping Use   Vaping status: Never Used  Substance and Sexual Activity   Alcohol use: Yes    Comment: social use   Drug use: No   Sexual activity: Yes

## 2023-01-31 ENCOUNTER — Telehealth: Payer: Self-pay

## 2023-01-31 NOTE — Telephone Encounter (Signed)
Patient informed PSA results elevated, 4.2. Per patient's request, results faxed to Dr. Joycelyn Rua [facility]) for further recommendations.

## 2023-01-31 NOTE — Telephone Encounter (Signed)
Error

## 2023-02-22 ENCOUNTER — Other Ambulatory Visit (HOSPITAL_BASED_OUTPATIENT_CLINIC_OR_DEPARTMENT_OTHER): Payer: Self-pay

## 2023-02-22 MED ORDER — FLUTICASONE-SALMETEROL 115-21 MCG/ACT IN AERO
2.0000 | INHALATION_SPRAY | Freq: Two times a day (BID) | RESPIRATORY_TRACT | 3 refills | Status: AC
  Filled 2023-02-22: qty 12, 30d supply, fill #0

## 2023-03-22 DIAGNOSIS — R3912 Poor urinary stream: Secondary | ICD-10-CM | POA: Diagnosis not present

## 2023-03-22 DIAGNOSIS — R3121 Asymptomatic microscopic hematuria: Secondary | ICD-10-CM | POA: Diagnosis not present

## 2023-03-22 DIAGNOSIS — N401 Enlarged prostate with lower urinary tract symptoms: Secondary | ICD-10-CM | POA: Diagnosis not present

## 2023-03-22 DIAGNOSIS — R972 Elevated prostate specific antigen [PSA]: Secondary | ICD-10-CM | POA: Diagnosis not present

## 2023-04-03 DIAGNOSIS — N200 Calculus of kidney: Secondary | ICD-10-CM | POA: Diagnosis not present

## 2023-04-03 DIAGNOSIS — N281 Cyst of kidney, acquired: Secondary | ICD-10-CM | POA: Diagnosis not present

## 2023-04-03 DIAGNOSIS — R3121 Asymptomatic microscopic hematuria: Secondary | ICD-10-CM | POA: Diagnosis not present

## 2023-04-03 DIAGNOSIS — R3129 Other microscopic hematuria: Secondary | ICD-10-CM | POA: Diagnosis not present

## 2023-05-22 DIAGNOSIS — N200 Calculus of kidney: Secondary | ICD-10-CM | POA: Diagnosis not present

## 2023-05-22 DIAGNOSIS — R3912 Poor urinary stream: Secondary | ICD-10-CM | POA: Diagnosis not present

## 2023-05-22 DIAGNOSIS — N401 Enlarged prostate with lower urinary tract symptoms: Secondary | ICD-10-CM | POA: Diagnosis not present

## 2023-05-22 DIAGNOSIS — R3121 Asymptomatic microscopic hematuria: Secondary | ICD-10-CM | POA: Diagnosis not present

## 2023-05-22 DIAGNOSIS — R972 Elevated prostate specific antigen [PSA]: Secondary | ICD-10-CM | POA: Diagnosis not present

## 2023-09-19 DIAGNOSIS — Z0000 Encounter for general adult medical examination without abnormal findings: Secondary | ICD-10-CM | POA: Diagnosis not present

## 2023-09-19 DIAGNOSIS — R030 Elevated blood-pressure reading, without diagnosis of hypertension: Secondary | ICD-10-CM | POA: Diagnosis not present

## 2023-10-29 DIAGNOSIS — L820 Inflamed seborrheic keratosis: Secondary | ICD-10-CM | POA: Diagnosis not present

## 2023-10-29 DIAGNOSIS — Z85828 Personal history of other malignant neoplasm of skin: Secondary | ICD-10-CM | POA: Diagnosis not present

## 2023-10-29 DIAGNOSIS — L821 Other seborrheic keratosis: Secondary | ICD-10-CM | POA: Diagnosis not present

## 2023-10-29 DIAGNOSIS — D225 Melanocytic nevi of trunk: Secondary | ICD-10-CM | POA: Diagnosis not present

## 2023-10-29 DIAGNOSIS — L578 Other skin changes due to chronic exposure to nonionizing radiation: Secondary | ICD-10-CM | POA: Diagnosis not present

## 2023-10-29 DIAGNOSIS — L814 Other melanin hyperpigmentation: Secondary | ICD-10-CM | POA: Diagnosis not present

## 2023-10-29 DIAGNOSIS — L570 Actinic keratosis: Secondary | ICD-10-CM | POA: Diagnosis not present

## 2024-02-14 DIAGNOSIS — H524 Presbyopia: Secondary | ICD-10-CM | POA: Diagnosis not present

## 2024-02-14 DIAGNOSIS — H5203 Hypermetropia, bilateral: Secondary | ICD-10-CM | POA: Diagnosis not present
